# Patient Record
Sex: Male | Born: 1955 | Race: Black or African American | Hispanic: No | Smoking: Current every day smoker
Health system: Southern US, Community
[De-identification: ages and names within clinical notes are randomized; demographics above are authoritative.]

## PROBLEM LIST (undated history)

## (undated) DIAGNOSIS — R413 Other amnesia: Secondary | ICD-10-CM

## (undated) DIAGNOSIS — I679 Cerebrovascular disease, unspecified: Secondary | ICD-10-CM

## (undated) DIAGNOSIS — J320 Chronic maxillary sinusitis: Secondary | ICD-10-CM

## (undated) DIAGNOSIS — I10 Essential (primary) hypertension: Secondary | ICD-10-CM

## (undated) DIAGNOSIS — I639 Cerebral infarction, unspecified: Secondary | ICD-10-CM

## (undated) DIAGNOSIS — R569 Unspecified convulsions: Secondary | ICD-10-CM

## (undated) DIAGNOSIS — F10239 Alcohol dependence with withdrawal, unspecified: Secondary | ICD-10-CM

## (undated) DIAGNOSIS — F10939 Alcohol use, unspecified with withdrawal, unspecified: Secondary | ICD-10-CM

## (undated) DIAGNOSIS — F1010 Alcohol abuse, uncomplicated: Secondary | ICD-10-CM

## (undated) HISTORY — PX: BACK SURGERY: SHX140

## (undated) HISTORY — DX: Essential (primary) hypertension: I10

## (undated) HISTORY — DX: Cerebral infarction, unspecified: I63.9

---

## 2013-12-28 ENCOUNTER — Emergency Department (HOSPITAL_COMMUNITY): Payer: Medicaid Other

## 2013-12-28 ENCOUNTER — Inpatient Hospital Stay (HOSPITAL_COMMUNITY)
Admission: EM | Admit: 2013-12-28 | Discharge: 2014-01-03 | DRG: 896 | Disposition: A | Discharge status: Home / Self Care | Payer: Medicaid Other | Attending: Internal Medicine | Admitting: Internal Medicine

## 2013-12-28 ENCOUNTER — Encounter (HOSPITAL_COMMUNITY): Payer: Self-pay

## 2013-12-28 DIAGNOSIS — F1994 Other psychoactive substance use, unspecified with psychoactive substance-induced mood disorder: Secondary | ICD-10-CM

## 2013-12-28 DIAGNOSIS — F10231 Alcohol dependence with withdrawal delirium: Secondary | ICD-10-CM | POA: Diagnosis present

## 2013-12-28 DIAGNOSIS — F39 Unspecified mood [affective] disorder: Secondary | ICD-10-CM | POA: Diagnosis present

## 2013-12-28 DIAGNOSIS — Z79899 Other long term (current) drug therapy: Secondary | ICD-10-CM | POA: Diagnosis not present

## 2013-12-28 DIAGNOSIS — I10 Essential (primary) hypertension: Secondary | ICD-10-CM | POA: Diagnosis present

## 2013-12-28 DIAGNOSIS — F10930 Alcohol use, unspecified with withdrawal, uncomplicated: Secondary | ICD-10-CM

## 2013-12-28 DIAGNOSIS — F10239 Alcohol dependence with withdrawal, unspecified: Secondary | ICD-10-CM | POA: Diagnosis present

## 2013-12-28 DIAGNOSIS — R569 Unspecified convulsions: Secondary | ICD-10-CM

## 2013-12-28 DIAGNOSIS — G9340 Encephalopathy, unspecified: Secondary | ICD-10-CM | POA: Diagnosis present

## 2013-12-28 DIAGNOSIS — R739 Hyperglycemia, unspecified: Secondary | ICD-10-CM | POA: Diagnosis present

## 2013-12-28 DIAGNOSIS — Z8673 Personal history of transient ischemic attack (TIA), and cerebral infarction without residual deficits: Secondary | ICD-10-CM | POA: Diagnosis not present

## 2013-12-28 DIAGNOSIS — F10939 Alcohol use, unspecified with withdrawal, unspecified: Secondary | ICD-10-CM | POA: Diagnosis present

## 2013-12-28 DIAGNOSIS — W19XXXA Unspecified fall, initial encounter: Secondary | ICD-10-CM

## 2013-12-28 DIAGNOSIS — F17210 Nicotine dependence, cigarettes, uncomplicated: Secondary | ICD-10-CM | POA: Diagnosis present

## 2013-12-28 DIAGNOSIS — I159 Secondary hypertension, unspecified: Secondary | ICD-10-CM

## 2013-12-28 DIAGNOSIS — Z7982 Long term (current) use of aspirin: Secondary | ICD-10-CM | POA: Diagnosis not present

## 2013-12-28 DIAGNOSIS — I679 Cerebrovascular disease, unspecified: Secondary | ICD-10-CM | POA: Diagnosis present

## 2013-12-28 DIAGNOSIS — E876 Hypokalemia: Secondary | ICD-10-CM | POA: Diagnosis present

## 2013-12-28 DIAGNOSIS — F1010 Alcohol abuse, uncomplicated: Secondary | ICD-10-CM

## 2013-12-28 DIAGNOSIS — Z9289 Personal history of other medical treatment: Secondary | ICD-10-CM

## 2013-12-28 DIAGNOSIS — J320 Chronic maxillary sinusitis: Secondary | ICD-10-CM | POA: Diagnosis present

## 2013-12-28 DIAGNOSIS — F10931 Alcohol use, unspecified with withdrawal delirium: Secondary | ICD-10-CM

## 2013-12-28 DIAGNOSIS — Z09 Encounter for follow-up examination after completed treatment for conditions other than malignant neoplasm: Secondary | ICD-10-CM

## 2013-12-28 DIAGNOSIS — F10230 Alcohol dependence with withdrawal, uncomplicated: Secondary | ICD-10-CM

## 2013-12-28 DIAGNOSIS — F10929 Alcohol use, unspecified with intoxication, unspecified: Secondary | ICD-10-CM

## 2013-12-28 HISTORY — DX: Alcohol dependence with withdrawal, unspecified: F10.239

## 2013-12-28 HISTORY — DX: Alcohol use, unspecified with withdrawal, unspecified: F10.939

## 2013-12-28 HISTORY — DX: Other amnesia: R41.3

## 2013-12-28 HISTORY — DX: Unspecified convulsions: R56.9

## 2013-12-28 HISTORY — DX: Cerebrovascular disease, unspecified: I67.9

## 2013-12-28 HISTORY — DX: Chronic maxillary sinusitis: J32.0

## 2013-12-28 HISTORY — DX: Alcohol abuse, uncomplicated: F10.10

## 2013-12-28 LAB — BASIC METABOLIC PANEL
ANION GAP: 19 — AB (ref 5–15)
Anion gap: 25 — ABNORMAL HIGH (ref 5–15)
BUN: 5 mg/dL — ABNORMAL LOW (ref 6–23)
BUN: 6 mg/dL (ref 6–23)
CHLORIDE: 96 meq/L (ref 96–112)
CO2: 22 mEq/L (ref 19–32)
CO2: 18 mEq/L — ABNORMAL LOW (ref 19–32)
CREATININE: 0.84 mg/dL (ref 0.50–1.35)
Calcium: 9.8 mg/dL (ref 8.4–10.5)
Calcium: 10.1 mg/dL (ref 8.4–10.5)
Chloride: 95 mEq/L — ABNORMAL LOW (ref 96–112)
Creatinine, Ser: 0.83 mg/dL (ref 0.50–1.35)
GFR calc non Af Amer: >90 mL/min (ref >90)
Glucose, Bld: 147 mg/dL — ABNORMAL HIGH (ref 70–99)
Glucose, Bld: 136 mg/dL — ABNORMAL HIGH (ref 70–99)
Potassium: 3.2 mEq/L — ABNORMAL LOW (ref 3.7–5.3)
Potassium: 3.6 mEq/L — ABNORMAL LOW (ref 3.7–5.3)
Sodium: 137 mEq/L (ref 137–147)
Sodium: 138 mEq/L (ref 137–147)

## 2013-12-28 LAB — ETHANOL: Alcohol, Ethyl (B): <11 mg/dL (ref 0–11)

## 2013-12-28 LAB — CBC WITH DIFFERENTIAL/PLATELET
BASOS ABS: 0.1 10*3/uL (ref 0.0–0.1)
BASOS PCT: 1 % (ref 0–1)
EOS ABS: 0.1 10*3/uL (ref 0.0–0.7)
Eosinophils Relative: 2 % (ref 0–5)
HEMATOCRIT: 44.7 % (ref 39.0–52.0)
HEMOGLOBIN: 15.4 g/dL (ref 13.0–17.0)
Lymphocytes Relative: 25 % (ref 12–46)
Lymphs Abs: 1.6 10*3/uL (ref 0.7–4.0)
MCH: 32.8 pg (ref 26.0–34.0)
MCHC: 34.5 g/dL (ref 30.0–36.0)
MCV: 95.1 fL (ref 78.0–100.0)
MONO ABS: 0.6 10*3/uL (ref 0.1–1.0)
Monocytes Relative: 9 % (ref 3–12)
NEUTROS ABS: 4.1 10*3/uL (ref 1.7–7.7)
NEUTROS PCT: 63 % (ref 43–77)
Platelets: 199 10*3/uL (ref 150–400)
RBC: 4.7 MIL/uL (ref 4.22–5.81)
RDW: 13.1 % (ref 11.5–15.5)
WBC: 6.5 10*3/uL (ref 4.0–10.5)

## 2013-12-28 LAB — RAPID URINE DRUG SCREEN, HOSP PERFORMED
Amphetamines: NOT DETECTED
BENZODIAZEPINES: NOT DETECTED
Barbiturates: NOT DETECTED
COCAINE: NOT DETECTED
OPIATES: NOT DETECTED
TETRAHYDROCANNABINOL: NOT DETECTED

## 2013-12-28 LAB — MAGNESIUM: MAGNESIUM: 1.4 mg/dL — AB (ref 1.5–2.5)

## 2013-12-28 LAB — CREATININE, SERUM
CREATININE: 0.8 mg/dL (ref 0.50–1.35)
GFR calc non Af Amer: >90 mL/min (ref >90)

## 2013-12-28 MED ORDER — NICOTINE 21 MG/24HR TD PT24
21.0000 mg | MEDICATED_PATCH | Freq: Every day | TRANSDERMAL | Status: DC | PRN
  Filled 2013-12-28: qty 1

## 2013-12-28 MED ORDER — CHLORDIAZEPOXIDE HCL 25 MG PO CAPS: 25.0000 mg | ORAL_CAPSULE | Freq: Four times a day (QID) | ORAL | Status: DC | PRN

## 2013-12-28 MED ORDER — ACETAMINOPHEN 325 MG PO TABS: 650.0000 mg | ORAL_TABLET | Freq: Four times a day (QID) | ORAL | Status: DC | PRN

## 2013-12-28 MED ORDER — VITAMIN B-1 100 MG PO TABS
100.0000 mg | ORAL_TABLET | Freq: Every day | ORAL | Status: DC
  Administered 2013-12-29 – 2014-01-03 (×5): 100 mg via ORAL
  Filled 2013-12-28 (×5): qty 1

## 2013-12-28 MED ORDER — CHLORDIAZEPOXIDE HCL 25 MG PO CAPS: 25.0000 mg | ORAL_CAPSULE | Freq: Every day | ORAL | Status: DC

## 2013-12-28 MED ORDER — ADULT MULTIVITAMIN W/MINERALS CH
1.0000 | ORAL_TABLET | Freq: Every day | ORAL | Status: DC
  Administered 2013-12-29 – 2014-01-03 (×6): 1 via ORAL
  Filled 2013-12-28 (×6): qty 1

## 2013-12-28 MED ORDER — ADULT MULTIVITAMIN W/MINERALS CH: 1.0000 | ORAL_TABLET | Freq: Every day | ORAL | Status: DC

## 2013-12-28 MED ORDER — VITAMIN B-1 100 MG PO TABS
100.0000 mg | ORAL_TABLET | Freq: Every day | ORAL | Status: DC
  Filled 2013-12-28: qty 1

## 2013-12-28 MED ORDER — LORAZEPAM 2 MG/ML IJ SOLN
1.0000 mg | Freq: Once | INTRAMUSCULAR | Status: AC
  Administered 2013-12-28: 1 mg via INTRAVENOUS
  Filled 2013-12-28: qty 1

## 2013-12-28 MED ORDER — ONDANSETRON HCL 4 MG PO TABS: 4.0000 mg | ORAL_TABLET | Freq: Three times a day (TID) | ORAL | Status: DC | PRN

## 2013-12-28 MED ORDER — ALUM & MAG HYDROXIDE-SIMETH 200-200-20 MG/5ML PO SUSP: 30.0000 mL | ORAL | Status: DC | PRN

## 2013-12-28 MED ORDER — HEPARIN SODIUM (PORCINE) 5000 UNIT/ML IJ SOLN
5000.0000 [IU] | Freq: Three times a day (TID) | INTRAMUSCULAR | Status: DC
  Administered 2013-12-28 – 2014-01-03 (×16): 5000 [IU] via SUBCUTANEOUS
  Filled 2013-12-28 (×20): qty 1

## 2013-12-28 MED ORDER — LORAZEPAM 2 MG/ML IJ SOLN
1.0000 mg | Freq: Four times a day (QID) | INTRAMUSCULAR | Status: DC | PRN
  Administered 2013-12-29 – 2013-12-30 (×4): 1 mg via INTRAVENOUS
  Filled 2013-12-28 (×5): qty 1

## 2013-12-28 MED ORDER — LORAZEPAM 1 MG PO TABS: 0.0000 mg | ORAL_TABLET | Freq: Two times a day (BID) | ORAL | Status: DC

## 2013-12-28 MED ORDER — THIAMINE HCL 100 MG/ML IJ SOLN: 100.0000 mg | Freq: Every day | INTRAMUSCULAR | Status: DC

## 2013-12-28 MED ORDER — CHLORDIAZEPOXIDE HCL 25 MG PO CAPS
25.0000 mg | ORAL_CAPSULE | Freq: Every day | ORAL | Status: DC
Start: 2014-01-01 — End: 2013-12-28

## 2013-12-28 MED ORDER — NICOTINE 21 MG/24HR TD PT24: 21.0000 mg | MEDICATED_PATCH | Freq: Every day | TRANSDERMAL | Status: DC | PRN

## 2013-12-28 MED ORDER — MAGNESIUM HYDROXIDE 400 MG/5ML PO SUSP: 30.0000 mL | Freq: Every day | ORAL | Status: DC | PRN

## 2013-12-28 MED ORDER — DONEPEZIL HCL 10 MG PO TABS
10.0000 mg | ORAL_TABLET | Freq: Every day | ORAL | Status: DC
  Administered 2013-12-28 – 2014-01-02 (×4): 10 mg via ORAL
  Filled 2013-12-28 (×7): qty 1

## 2013-12-28 MED ORDER — ADULT MULTIVITAMIN W/MINERALS CH
1.0000 | ORAL_TABLET | Freq: Every day | ORAL | Status: DC
  Administered 2013-12-28: 1 via ORAL
  Filled 2013-12-28: qty 1

## 2013-12-28 MED ORDER — CLONIDINE HCL 0.2 MG/24HR TD PTWK
0.2000 mg | MEDICATED_PATCH | TRANSDERMAL | Status: DC
  Administered 2013-12-28: 0.2 mg via TRANSDERMAL
  Filled 2013-12-28: qty 1

## 2013-12-28 MED ORDER — ASPIRIN 81 MG PO TABS: 81.0000 mg | ORAL_TABLET | Freq: Every evening | ORAL | Status: DC

## 2013-12-28 MED ORDER — CHLORDIAZEPOXIDE HCL 25 MG PO CAPS: 25.0000 mg | ORAL_CAPSULE | ORAL | Status: DC

## 2013-12-28 MED ORDER — OXYCODONE HCL 5 MG PO TABS: 5.0000 mg | ORAL_TABLET | ORAL | Status: DC | PRN

## 2013-12-28 MED ORDER — PROMETHAZINE HCL 25 MG PO TABS
12.5000 mg | ORAL_TABLET | Freq: Four times a day (QID) | ORAL | Status: DC | PRN
Start: 2013-12-28 — End: 2014-01-03

## 2013-12-28 MED ORDER — LORAZEPAM 1 MG PO TABS
1.0000 mg | ORAL_TABLET | Freq: Three times a day (TID) | ORAL | Status: DC | PRN
  Administered 2013-12-28: 1 mg via ORAL
  Filled 2013-12-28: qty 1

## 2013-12-28 MED ORDER — HYDROXYZINE HCL 25 MG PO TABS
25.0000 mg | ORAL_TABLET | Freq: Four times a day (QID) | ORAL | Status: DC | PRN
Start: 2013-12-28 — End: 2013-12-28

## 2013-12-28 MED ORDER — POTASSIUM CHLORIDE IN NACL 40-0.9 MEQ/L-% IV SOLN
INTRAVENOUS | Status: DC
  Administered 2013-12-28 – 2013-12-30 (×6): 75 mL/h via INTRAVENOUS
  Filled 2013-12-28 (×5): qty 1000

## 2013-12-28 MED ORDER — LORAZEPAM 1 MG PO TABS
0.0000 mg | ORAL_TABLET | Freq: Four times a day (QID) | ORAL | Status: DC
  Administered 2013-12-28 (×2): 2 mg via ORAL
  Filled 2013-12-28 (×2): qty 2

## 2013-12-28 MED ORDER — VITAMIN B-1 100 MG PO TABS
100.0000 mg | ORAL_TABLET | Freq: Every day | ORAL | Status: DC
  Administered 2013-12-28: 100 mg via ORAL
  Filled 2013-12-28: qty 1

## 2013-12-28 MED ORDER — FOLIC ACID 1 MG PO TABS
1.0000 mg | ORAL_TABLET | Freq: Every day | ORAL | Status: DC
  Administered 2013-12-28 – 2014-01-03 (×7): 1 mg via ORAL
  Filled 2013-12-28 (×7): qty 1

## 2013-12-28 MED ORDER — LORAZEPAM 1 MG PO TABS
1.0000 mg | ORAL_TABLET | Freq: Four times a day (QID) | ORAL | Status: DC | PRN
Start: 2013-12-28 — End: 2013-12-30

## 2013-12-28 MED ORDER — CHLORDIAZEPOXIDE HCL 25 MG PO CAPS: 25.0000 mg | ORAL_CAPSULE | Freq: Three times a day (TID) | ORAL | Status: DC

## 2013-12-28 MED ORDER — CHLORDIAZEPOXIDE HCL 25 MG PO CAPS
25.0000 mg | ORAL_CAPSULE | Freq: Four times a day (QID) | ORAL | Status: DC
  Administered 2013-12-28: 25 mg via ORAL
  Filled 2013-12-28 (×2): qty 1

## 2013-12-28 MED ORDER — LORAZEPAM 2 MG/ML IJ SOLN
0.0000 mg | Freq: Two times a day (BID) | INTRAMUSCULAR | Status: DC
Start: 2013-12-30 — End: 2013-12-30

## 2013-12-28 MED ORDER — LORAZEPAM 2 MG/ML IJ SOLN
0.0000 mg | Freq: Four times a day (QID) | INTRAMUSCULAR | Status: DC
  Administered 2013-12-29: 3 mg via INTRAVENOUS
  Administered 2013-12-29: 1 mg via INTRAVENOUS
  Administered 2013-12-29: 2 mg via INTRAVENOUS
  Administered 2013-12-29: 1 mg via INTRAVENOUS
  Administered 2013-12-29: 2 mg via INTRAVENOUS
  Administered 2013-12-30: 3 mg via INTRAVENOUS
  Administered 2013-12-30: 4 mg via INTRAVENOUS
  Filled 2013-12-28: qty 1
  Filled 2013-12-28 (×2): qty 2
  Filled 2013-12-28 (×4): qty 1
  Filled 2013-12-28: qty 2

## 2013-12-28 MED ORDER — ONDANSETRON HCL 4 MG/2ML IJ SOLN: 4.0000 mg | Freq: Three times a day (TID) | INTRAMUSCULAR | Status: AC | PRN

## 2013-12-28 MED ORDER — SODIUM CHLORIDE 0.9 % IV BOLUS (SEPSIS)
1000.0000 mL | Freq: Once | INTRAVENOUS | Status: AC
  Administered 2013-12-28: 1000 mL via INTRAVENOUS

## 2013-12-28 MED ORDER — LOPERAMIDE HCL 2 MG PO CAPS
2.0000 mg | ORAL_CAPSULE | ORAL | Status: DC | PRN
  Administered 2013-12-28: 2 mg via ORAL
  Filled 2013-12-28: qty 1

## 2013-12-28 MED ORDER — THIAMINE HCL 100 MG/ML IJ SOLN
100.0000 mg | Freq: Every day | INTRAMUSCULAR | Status: DC
Start: 2013-12-29 — End: 2014-01-02
  Administered 2013-12-30: 100 mg via INTRAVENOUS
  Filled 2013-12-28: qty 2
  Filled 2013-12-28: qty 1

## 2013-12-28 MED ORDER — ACETAMINOPHEN 325 MG PO TABS
650.0000 mg | ORAL_TABLET | Freq: Four times a day (QID) | ORAL | Status: DC | PRN
  Administered 2013-12-29: 650 mg via ORAL
  Filled 2013-12-28: qty 2

## 2013-12-28 MED ORDER — HYDROXYZINE HCL 25 MG PO TABS: 25.0000 mg | ORAL_TABLET | Freq: Four times a day (QID) | ORAL | Status: DC | PRN

## 2013-12-28 MED ORDER — SODIUM CHLORIDE 0.9 % IJ SOLN
3.0000 mL | Freq: Two times a day (BID) | INTRAMUSCULAR | Status: DC
  Administered 2013-12-29 – 2014-01-02 (×7): 3 mL via INTRAVENOUS

## 2013-12-28 MED ORDER — NICOTINE 14 MG/24HR TD PT24
14.0000 mg | MEDICATED_PATCH | Freq: Every day | TRANSDERMAL | Status: DC
  Administered 2013-12-29 – 2014-01-03 (×6): 14 mg via TRANSDERMAL
  Filled 2013-12-28 (×6): qty 1

## 2013-12-28 MED ORDER — ALUM & MAG HYDROXIDE-SIMETH 200-200-20 MG/5ML PO SUSP: 30.0000 mL | Freq: Four times a day (QID) | ORAL | Status: DC | PRN

## 2013-12-28 MED ORDER — POTASSIUM CHLORIDE CRYS ER 20 MEQ PO TBCR
40.0000 meq | EXTENDED_RELEASE_TABLET | Freq: Once | ORAL | Status: AC
  Administered 2013-12-28: 40 meq via ORAL
  Filled 2013-12-28: qty 2

## 2013-12-28 MED ORDER — CHLORDIAZEPOXIDE HCL 25 MG PO CAPS
25.0000 mg | ORAL_CAPSULE | Freq: Four times a day (QID) | ORAL | Status: DC
  Administered 2013-12-28: 25 mg via ORAL
  Filled 2013-12-28: qty 1

## 2013-12-28 MED ORDER — ACETAMINOPHEN 650 MG RE SUPP: 650.0000 mg | Freq: Four times a day (QID) | RECTAL | Status: DC | PRN

## 2013-12-28 MED ORDER — ASPIRIN EC 81 MG PO TBEC
81.0000 mg | DELAYED_RELEASE_TABLET | Freq: Every evening | ORAL | Status: DC
  Administered 2013-12-28 – 2014-01-02 (×5): 81 mg via ORAL
  Filled 2013-12-28 (×6): qty 1

## 2013-12-28 MED ORDER — LOPERAMIDE HCL 2 MG PO CAPS: 2.0000 mg | ORAL_CAPSULE | ORAL | Status: DC | PRN

## 2013-12-28 NOTE — ED Notes (Signed)
Per EMS- Patient had a seizure at home and discovered by his son. Patient disoriented to time. No obvious injury. Patient c/o headache.

## 2013-12-28 NOTE — ED Notes (Signed)
Patient's belongings pants, belt, shirt and coat placed in belongings bags x 2 and to be taken home by patient's girlfriend.

## 2013-12-28 NOTE — ED Provider Notes (Signed)
The patient has had a progressive confusion, on exam the patient has a mild tremor, I suspect that he is going into delirium tremens and thus I have admitted him to the hospital under the service of Dr. Darnelle Catalanama of the hospitalist service. He will go to a stepdown bed.  Derrick RollerBrian D Naeem Quillin, MD 12/28/13 [phone]

## 2013-12-28 NOTE — Progress Notes (Signed)
Utilization Review completed.  Jammi Morrissette RN CM  

## 2013-12-28 NOTE — ED Notes (Signed)
Patient tried to urinate but couldn't, urinal is with patient, to try again soon

## 2013-12-28 NOTE — ED Notes (Signed)
Attempted to call report. Shift change occuring and receiving nurse will call back for report.

## 2013-12-28 NOTE — ED Notes (Signed)
Patient's fiance reported to EDP that the patient was trying to detox himself from alcohol and had a seizure. Patient requested detox to EDP.

## 2013-12-28 NOTE — ED Provider Notes (Signed)
CSN: [id number]     Arrival date & time 12/28/13  0804 History   First MD Initiated Contact with Patient 12/28/13 [phone]     Chief Complaint  Patient presents with  . Seizures  . Withdrawal  . Alcohol Problem      HPI  Pt was seen at 0825. Per EMS, pt's family and pt: c/o sudden onset and resolution of one episode of "seizure" that occurred this morning PTA. Pt's wife was on the phone with pt and states "he didn't sound right," so she had her son check on him. Pt was found by him s/p seizure. Pt states he has been "cutting down drinking" etoh for the past 1 week. Pt's LD etoh approximately 2000 last night. Pt's family states he has been "Proofreadergetting shakier and shakier" over the past few days. Pt has hx of alcohol withdrawal seizures several years ago. Pt requesting etoh detox. Pt's only c/o "feeling really shaky now." Denies SI, no SA, no HI, no hallucinations.    Past Medical History  Diagnosis Date  . Memory problem   . Alcohol abuse   . Alcohol withdrawal seizure    Past Surgical History  Procedure Laterality Date  . Back surgery      History  Substance Use Topics  . Smoking status: Current Every Day Smoker -- 0.50 packs/day    Types: Cigarettes  . Smokeless tobacco: Never Used  . Alcohol Use: Yes     Comment: lasst drink at 1000 last night.    Review of Systems ROS: Statement: All systems negative except as marked or noted in the HPI; Constitutional: Negative for fever and chills. ; ; Eyes: Negative for eye pain, redness and discharge. ; ; ENMT: Negative for ear pain, hoarseness, nasal congestion, sinus pressure and sore throat. ; ; Cardiovascular: Negative for chest pain, palpitations, diaphoresis, dyspnea and peripheral edema. ; ; Respiratory: Negative for cough, wheezing and stridor. ; ; Gastrointestinal: Negative for nausea, vomiting, diarrhea, abdominal pain, blood in stool, hematemesis, jaundice and rectal bleeding. . ; ; Genitourinary: Negative for dysuria, flank pain and  hematuria. ; ; Musculoskeletal: Negative for back pain and neck pain. Negative for swelling and trauma.; ; Skin: Negative for pruritus, rash, abrasions, blisters, bruising and skin lesion.; ; Neuro: Negative for headache, lightheadedness and neck stiffness. Negative for weakness, extremity weakness, paresthesias, +seizure.     Allergies  Review of patient's allergies indicates no known allergies.  Home Medications   Prior to Admission medications   Medication Sig Start Date End Date Taking? Authorizing Provider  aspirin 81 MG tablet Take 81 mg by mouth every evening.   Yes Historical Provider, MD  donepezil (ARICEPT) 10 MG tablet Take 10 mg by mouth at bedtime.   Yes Historical Provider, MD  ibuprofen (ADVIL,MOTRIN) 200 MG tablet Take 1,000 mg by mouth every 6 (six) hours as needed for mild pain or moderate pain.   Yes Historical Provider, MD   BP 130/77 mmHg  Pulse 92  Temp(Src) 98.9 F (37.2 C) (Oral)  Resp 16  SpO2 95%    Physical Exam  0830: Physical examination: Vital signs and O2 SAT: Reviewed; Constitutional: Well developed, Well nourished, Well hydrated, In no acute distress; Head and Face: Normocephalic, No scalp hematomas, no lacs. +faint erythema to right lateral eyebrow area. No open wounds, no ecchymosis. Non-tender to palp superior and inferior orbital rim areas.  No zygoma tenderness.  No mandibular tenderness.; Eyes: EOMI, PERRL, No scleral icterus. No obvious hyphema bilat.; ENMT: Mouth and  pharynx normal, Left TM normal, Right TM normal, Mucous membranes moist, +teeth and tongue intact.  No intraoral or intranasal bleeding.  No septal hematomas.  No trismus, no malocclusion.; Neck: Supple, Full range of motion, No lymphadenopathy;  Cardiovascular: Regular rate and rhythm, No gallop; Respiratory: Breath sounds clear & equal bilaterally, No rales, rhonchi, wheezes.  Speaking full sentences with ease, Normal respiratory effort/excursion; Chest: Nontender, Movement normal;  Abdomen: Soft, Nontender, Nondistended, Normal bowel sounds; Genitourinary: No CVA tenderness; Extremities: Pulses normal, No tenderness, No edema, No calf edema or asymmetry.; Neuro: AA&Ox3, Major CN grossly intact. Speech clear. No gross focal motor or sensory deficits in extremities. +tremorous during exam.; Skin: Color normal, Warm, Dry.   ED Course  Procedures     MDM  MDM Reviewed: vitals and nursing note Interpretation: labs and CT scan      Results for orders placed or performed during the hospital encounter of 12/28/13  Ethanol  Result Value Ref Range   Alcohol, Ethyl (B) <11 0 - 11 mg/dL  Urine rapid drug screen (hosp performed)  Result Value Ref Range   Opiates NONE DETECTED NONE DETECTED   Cocaine NONE DETECTED NONE DETECTED   Benzodiazepines NONE DETECTED NONE DETECTED   Amphetamines NONE DETECTED NONE DETECTED   Tetrahydrocannabinol NONE DETECTED NONE DETECTED   Barbiturates NONE DETECTED NONE DETECTED  CBC with Differential  Result Value Ref Range   WBC 6.5 4.0 - 10.5 K/uL   RBC 4.70 4.22 - 5.81 MIL/uL   Hemoglobin 15.4 13.0 - 17.0 g/dL   HCT 16.144.7 09.639.0 - 04.552.0 %   MCV 95.1 78.0 - 100.0 fL   MCH 32.8 26.0 - 34.0 pg   MCHC 34.5 30.0 - 36.0 g/dL   RDW 40.913.1 81.111.5 - 91.415.5 %   Platelets 199 150 - 400 K/uL   Neutrophils Relative % 63 43 - 77 %   Neutro Abs 4.1 1.7 - 7.7 K/uL   Lymphocytes Relative 25 12 - 46 %   Lymphs Abs 1.6 0.7 - 4.0 K/uL   Monocytes Relative 9 3 - 12 %   Monocytes Absolute 0.6 0.1 - 1.0 K/uL   Eosinophils Relative 2 0 - 5 %   Eosinophils Absolute 0.1 0.0 - 0.7 K/uL   Basophils Relative 1 0 - 1 %   Basophils Absolute 0.1 0.0 - 0.1 K/uL  Basic metabolic panel  Result Value Ref Range   Sodium 137 137 - 147 mEq/L   Potassium 3.2 (L) 3.7 - 5.3 mEq/L   Chloride 96 96 - 112 mEq/L   CO2 22 19 - 32 mEq/L   Glucose, Bld 147 (H) 70 - 99 mg/dL   BUN 5 (L) 6 - 23 mg/dL   Creatinine, Ser 7.820.83 0.50 - 1.35 mg/dL   Calcium 9.8 8.4 - 95.610.5 mg/dL   GFR  calc non Af Amer >90 >90 mL/min   GFR calc Af Amer >90 >90 mL/min   Anion gap 19 (H) 5 - 15    Ct Head Wo Contrast 12/28/2013   CLINICAL DATA:  By report, patient trying to withdraw from recent excessive alcohol use and sustained a seizure earlier today associated with headache. Prior history of alcohol withdrawal seizures.  EXAM: CT HEAD WITHOUT CONTRAST  TECHNIQUE: Contiguous axial images were obtained from the base of the skull through the vertex without intravenous contrast.  COMPARISON:  None.  FINDINGS: Moderate cortical and deep atrophy and moderate to severe cerebellar atrophy for age. Severe changes of small vessel disease of the  white matter diffusely with old lacunar strokes in the left basal ganglia and right thalamus. No mass lesion. No midline shift. No acute hemorrhage or hematoma. No extra-axial fluid collections. No evidence of acute infarction.  No skull fracture or other focal osseous abnormality involving the skull. Absence of the anterior inferior medial wall of the left maxillary sinus, with soft tissue protruding into the left nasal cavity, with severe mucosal thickening in the left maxillary sinus and thickening of the sinus walls, indicating chronicity. Apparent prior bilateral turbinectomies. Remaining visualized paranasal sinuses, bilateral mastoid air cells and bilateral middle ear cavities well aerated. Mild bilateral carotid siphon atherosclerosis.  IMPRESSION: 1. No acute intracranial abnormality. 2. Moderate to severe generalized atrophy and severe chronic microvascular ischemic changes of the white matter. 3. 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 4. Severe chronic left maxillary sinusitis. If the patient has not had a prior medial antrostomy, there is erosion of the anterior inferior medial wall of the sinus. Possible mucocele in the left nasal cavity.   Electronically Signed   By: Hulan Saas M.D.   On: 12/28/2013 10:24    1000:  CIWA  protocol ordered on pt's arrival to the ED with improvement in pt's tremors.  Pt's VS remain stable. TTS eval pending.     Samuel Jester, DO 12/28/13 1438

## 2013-12-28 NOTE — Consult Note (Signed)
Derrick Cook   Reason for Cook:  Alcohol detox Referring Physician:  EDP  Derrick Cook is an 59 y.o. male. Total Time spent with patient: 45 minutes  Assessment: AXIS I:  Alcohol Abuse and Substance Induced Mood Disorder AXIS II:  Deferred AXIS III:   Past Medical History  Diagnosis Date  . Memory problem   . Alcohol abuse   . Alcohol withdrawal seizure    AXIS IV:  other psychosocial or environmental problems AXIS V:  41-50 serious symptoms  Plan:  Inpatient detox  Subjective:   Derrick Cook is a 58 y.o. male patient presents to [facility] requesting alcohol detox.  HPI:  Patient states for the last six months he has been drinking less and less trying  off of alcohol.  Patient states he continues to drink daily.  Girlfriend at bedside and states that patient had a seizure 3 weeks ago related to decrease in alcohol intake and then he had another one to day.  Patient denies suicidal/homicidal ideation, psychosis, and paranoia.    HPI Elements:   Location:  alcohol abuse. Quality:  alcohol detox. Severity:  alcohol withdrawl. Timing:  1 day. Review of Systems  Neurological: Positive for seizures (Related to alcohol withdrawal).  Psychiatric/Behavioral: Positive for substance abuse. Negative for depression, suicidal ideas, hallucinations and memory loss. The patient is not nervous/anxious and does not have insomnia.   History reviewed. No pertinent family history.   Past Psychiatric History: Past Medical History  Diagnosis Date  . Memory problem   . Alcohol abuse   . Alcohol withdrawal seizure     reports that he has been smoking Cigarettes.  He has been smoking about 0.50 packs per day. He has never used smokeless tobacco. He reports that he drinks alcohol. He reports that he does not use illicit drugs. History reviewed. No pertinent family history. Family History Substance Abuse:  (unknown) Family Supports:  (patients fiance at bedside) Living  Arrangements: Other (Comment) (lives with fiance and her 2 children; recently moved from [place]) Can pt return to current living arrangement?: Yes Abuse/Neglect [facility]) Physical Abuse: Denies Verbal Abuse: Denies Sexual Abuse: Denies Allergies:  No Known Allergies  ACT Assessment Complete:  Yes:    Educational Status    Risk to Self: Risk to self with the past 6 months Suicidal Ideation:  (unk; pt did not confirm nor deny; pt drowsy during the asses) Suicidal Intent: No Is patient at risk for suicide?: No Suicidal Plan?: No Access to Means: No What has been your use of drugs/alcohol within the last 12 months?:  (patient reports alchohol used last night; has a hx of abuse) Previous Attempts/Gestures:  (unk; pt did not confirm nor deny) How many times?:  (0) Other Self Harm Risks:  (n/a) Triggers for Past Attempts: Other (Comment) (no previous attempts/gestures reported ) Intentional Self Injurious Behavior: None Family Suicide History: Unknown Recent stressful life event(s): Other (Comment) (does not report any specific stressors; drowsy during assess) Persecutory voices/beliefs?: No Depression: Yes Depression Symptoms: Feeling angry/irritable, Feeling worthless/self pity, Loss of interest in usual pleasures, Guilt, Fatigue, Isolating, Tearfulness, Insomnia, Despondent Substance abuse history and/or treatment for substance abuse?: No Suicide prevention information given to non-admitted patients: Not applicable  Risk to Others: Risk to Others within the past 6 months Homicidal Ideation: No Thoughts of Harm to Others: No Current Homicidal Intent: No Current Homicidal Plan: No Access to Homicidal Means: No Identified Victim:  (n/a) History of harm to others?: No Assessment of Violence: None  Noted Violent Behavior Description:  (patient is calm and cooperative ) Does patient have access to weapons?: No Criminal Charges Pending?: No Does patient have a court date: No  Abuse:  Abuse/Neglect Assessment (Assessment to be complete while patient is alone) Physical Abuse: Denies Verbal Abuse: Denies Sexual Abuse: Denies Exploitation of patient/patient's resources: Denies Self-Neglect: Denies  Prior Inpatient Therapy: Prior Inpatient Therapy Prior Inpatient Therapy: Yes Prior Therapy Dates:  (uanble to recall dates ) Prior Therapy Facilty/Provider(s):  (patient unable to recall names of facilities) Reason for Treatment:  (alcohol (substance abuse))  Prior Outpatient Therapy: Prior Outpatient Therapy Prior Outpatient Therapy: No Prior Therapy Dates:  (n/a) Prior Therapy Facilty/Provider(s):  (n/a) Reason for Treatment:  (n/a)  Additional Information: Additional Information 1:1 In Past 12 Months?: No CIRT Risk: No Elopement Risk: No Does patient have medical clearance?: Yes                  Objective: Blood pressure 143/103, pulse 112, temperature 98.4 F (36.9 C), temperature source Oral, resp. rate 16, SpO2 97 %.There is no height or weight on file to calculate BMI. Results for orders placed or performed during the hospital encounter of 12/28/13 (from the past 72 hour(s))  Ethanol     Status: None   Collection Time: 12/28/13  8:34 AM  Result Value Ref Range   Alcohol, Ethyl (B) <11 0 - 11 mg/dL    Comment:        LOWEST DETECTABLE LIMIT FOR SERUM ALCOHOL IS 11 mg/dL FOR MEDICAL PURPOSES ONLY   CBC with Differential     Status: None   Collection Time: 12/28/13  8:34 AM  Result Value Ref Range   WBC 6.5 4.0 - 10.5 K/uL   RBC 4.70 4.22 - 5.81 MIL/uL   Hemoglobin 15.4 13.0 - 17.0 g/dL   HCT 44.7 39.0 - 52.0 %   MCV 95.1 78.0 - 100.0 fL   MCH 32.8 26.0 - 34.0 pg   MCHC 34.5 30.0 - 36.0 g/dL   RDW 13.1 11.5 - 15.5 %   Platelets 199 150 - 400 K/uL   Neutrophils Relative % 63 43 - 77 %   Neutro Abs 4.1 1.7 - 7.7 K/uL   Lymphocytes Relative 25 12 - 46 %   Lymphs Abs 1.6 0.7 - 4.0 K/uL   Monocytes Relative 9 3 - 12 %   Monocytes Absolute  0.6 0.1 - 1.0 K/uL   Eosinophils Relative 2 0 - 5 %   Eosinophils Absolute 0.1 0.0 - 0.7 K/uL   Basophils Relative 1 0 - 1 %   Basophils Absolute 0.1 0.0 - 0.1 K/uL  Basic metabolic panel     Status: Abnormal   Collection Time: 12/28/13  8:34 AM  Result Value Ref Range   Sodium 137 137 - 147 mEq/L   Potassium 3.2 (L) 3.7 - 5.3 mEq/L   Chloride 96 96 - 112 mEq/L   CO2 22 19 - 32 mEq/L   Glucose, Bld 147 (H) 70 - 99 mg/dL   BUN 5 (L) 6 - 23 mg/dL   Creatinine, Ser 0.83 0.50 - 1.35 mg/dL   Calcium 9.8 8.4 - 10.5 mg/dL   GFR calc non Af Amer >90 >90 mL/min   GFR calc Af Amer >90 >90 mL/min    Comment: (NOTE) The eGFR has been calculated using the CKD EPI equation. This calculation has not been validated in all clinical situations. eGFR's persistently <90 mL/min signify possible Chronic Kidney Disease.  Anion gap 19 (H) 5 - 15  Urine rapid drug screen (hosp performed)     Status: None   Collection Time: 12/28/13  9:58 AM  Result Value Ref Range   Opiates NONE DETECTED NONE DETECTED   Cocaine NONE DETECTED NONE DETECTED   Benzodiazepines NONE DETECTED NONE DETECTED   Amphetamines NONE DETECTED NONE DETECTED   Tetrahydrocannabinol NONE DETECTED NONE DETECTED   Barbiturates NONE DETECTED NONE DETECTED    Comment:        DRUG SCREEN FOR MEDICAL PURPOSES ONLY.  IF CONFIRMATION IS NEEDED FOR ANY PURPOSE, NOTIFY LAB WITHIN 5 DAYS.        LOWEST DETECTABLE LIMITS FOR URINE DRUG SCREEN Drug Class       Cutoff (ng/mL) Amphetamine      1000 Barbiturate      200 Benzodiazepine   643 Tricyclics       329 Opiates          300 Cocaine          300 THC              50    Labs are reviewed above values.  Medication reviewed librium started for alcohol detox  Current Facility-Administered Medications  Medication Dose Route Frequency Provider Last Rate Last Dose  . alum & mag hydroxide-simeth (MAALOX/MYLANTA) 200-200-20 MG/5ML suspension 30 mL  30 mL Oral PRN Francine Graven, DO       . LORazepam (ATIVAN) tablet 0-4 mg  0-4 mg Oral 4 times per day Francine Graven, DO   2 mg at 12/28/13 1155   Followed by  . [START ON 12/30/2013] LORazepam (ATIVAN) tablet 0-4 mg  0-4 mg Oral Q12H Francine Graven, DO      . LORazepam (ATIVAN) tablet 1 mg  1 mg Oral Q8H PRN Francine Graven, DO   1 mg at 12/28/13 0945  . nicotine (NICODERM CQ - dosed in mg/24 hours) patch 21 mg  21 mg Transdermal Daily PRN Francine Graven, DO      . ondansetron [facility]) tablet 4 mg  4 mg Oral Q8H PRN Francine Graven, DO      . thiamine (VITAMIN B-1) tablet 100 mg  100 mg Oral Daily Francine Graven, DO   100 mg at 12/28/13 0945   Or  . thiamine (B-1) injection 100 mg  100 mg Intravenous Daily Francine Graven, DO       Current Outpatient Prescriptions  Medication Sig Dispense Refill  . aspirin 81 MG tablet Take 81 mg by mouth every evening.    . donepezil (ARICEPT) 10 MG tablet Take 10 mg by mouth at bedtime.    Marland Kitchen ibuprofen (ADVIL,MOTRIN) 200 MG tablet Take 1,000 mg by mouth every 6 (six) hours as needed for mild pain or moderate pain.      Psychiatric Specialty Exam:     Blood pressure 143/103, pulse 112, temperature 98.4 F (36.9 C), temperature source Oral, resp. rate 16, SpO2 97 %.There is no height or weight on file to calculate BMI.  General Appearance: Casual and Disheveled  Eye Contact::  Minimal  Speech:  Clear and Coherent  Volume:  Decreased  Mood:  Depressed  Affect:  Congruent  Thought Process:  Circumstantial  Orientation:  Full (Time, Place, and Person)  Thought Content:  "want detox"  Suicidal Thoughts:  No  Homicidal Thoughts:  No  Memory:  Immediate;   Fair Recent;   Fair Remote;   Fair  Judgement:  Fair  Insight:  Present  Psychomotor Activity:  Decreased and Tremor  Concentration:  Fair  Recall:  [employer] [place]: Good  Akathisia:  No  Handed:  Right  AIMS (if indicated):     Assets:  Communication Skills Desire for  Improvement Housing Social Support  Sleep:      Musculoskeletal: Strength & Muscle Tone: within normal limits Gait & Station: Did not see patient ambulate Patient leans: N/A  Treatment Plan Summary: Daily contact with patient to assess and evaluate symptoms and progress in treatment Medication management Inpatient detox recommended   Patient has been accepted by [facility] 303/01  Rankin, Shuvon, FNP-BC 12/28/2013 5:05 PM  Patient seen, evaluated and I agree with notes by Nurse Practitioner. Corena Pilgrim, MD

## 2013-12-28 NOTE — ED Notes (Signed)
psych team into see patient

## 2013-12-28 NOTE — ED Notes (Signed)
Patient is confused and disoriented to place and time.  Denied etoh ingestion and stated "I had a stroke 2 days ago." There is no medical history to confirm this. C/O side pain.  Call to EDP.

## 2013-12-28 NOTE — H&P (Signed)
History and Physical:    Derrick Cook WUJ:[id number] DOB: Oct 21, 1955 DOA: 12/28/2013  Referring physician: Dr. Eber Hong PCP: No primary care provider on file.   Chief Complaint: Alcohol withdrawal  History of Present Illness:   Derrick Cook is an 58 y.o. male with a PMH of ETOH abuse, history of alcohol withdrawal seizures with a seizure noted this a.m., who we are asked to admit secondary to the development of DTs with prominent tremulousness, confusion and disorientation. Patient is too confused to provide any significant history although he does deny fever, chills, shortness of breath and chest pain. When asked additional questions about his symptoms, he repeatedly states "I don't know ". He is evasive and is completely disoriented except for self and place. The patient was brought to the emergency department by EMS for detox. According to notes from the psychiatric nurse practitioner, patient's girlfriend who was at the bedside at the time, stated that the patient has been trying to wean himself off alcohol over the past 6 months but that he continues to drink daily. He had a seizure 3 weeks ago triggered by a decrease in alcohol intake and had a second seizure today. His last drink was at 8 AM yesterday morning.  ROS:   Unable to obtain.   Past Medical History:   Past Medical History  Diagnosis Date  . Memory problem   . Alcohol abuse   . Alcohol withdrawal seizure   . 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 Chronic left maxillary sinusitis     Past Surgical History:   Past Surgical History  Procedure Laterality Date  . Back surgery      Social History:   History   Social History  . Marital Status: Significant Other    Spouse Name: N/A    Number of Children: N/A  . Years of Education: N/A   Occupational History  . Not on file.   Social History Main Topics  . Smoking status: Current Every Day Smoker --  0.50 packs/day    Types: Cigarettes  . Smokeless tobacco: Never Used  . Alcohol Use: 0.0 oz/week    0 Not specified per week     Comment: Heavy alcohol use  . Drug Use: No  . Sexual Activity: Not on file   Other Topics Concern  . Not on file   Social History Narrative   Patient recently moved from [place] to be with his fiance. He lives in the home with his fiance and her 2 children. Since moving to [place] to be with his fiance he has tried to stop drinking.     Family history:   Unable to obtain secondary to mental status changes.  Allergies   Review of patient's allergies indicates no known allergies.  Current Medications:   Prior to Admission medications   Medication Sig Start Date End Date Taking? Authorizing Provider  aspirin 81 MG tablet Take 81 mg by mouth every evening.   Yes Historical Provider, MD  donepezil (ARICEPT) 10 MG tablet Take 10 mg by mouth at bedtime.   Yes Historical Provider, MD  ibuprofen (ADVIL,MOTRIN) 200 MG tablet Take 1,000 mg by mouth every 6 (six) hours as needed for mild pain or moderate pain.   Yes Historical Provider, MD    Physical Exam:   Filed Vitals:   12/28/13 1320 12/28/13 1400 12/28/13 1658 12/28/13 1800  BP: 149/95 159/91 143/103 146/104  Pulse: 85  89 112 92  Temp:   98.4 F (36.9 C)   TempSrc:   Oral   Resp: 17 17 16 21   SpO2: 97% 98% 97% 95%     Physical Exam: Blood pressure 146/104, pulse 92, temperature 98.4 F (36.9 C), temperature source Oral, resp. rate 21, SpO2 95 %. Gen: Tremulous and restless Head: Normocephalic, mild abrasion left cheek area. Eyes: PERRL, EOMI, sclerae nonicteric. Mouth: Oropharynx clear with trauma to the lips. Neck: Supple, no thyromegaly, no lymphadenopathy, no jugular venous distention. Chest: Lungs diminished throughout. CV: Heart sounds are tachycardic, regular. No murmurs, rubs, or gallops. Abdomen: Soft, nontender, nondistended with normal active bowel sounds. Extremities: Extremities are  without clubbing, edema, or cyanosis. Skin: Warm and dry. Neuro: Alert and disoriented to date, month, year but does know president; cranial nerves II through XII grossly intact. Psych: Mood and affect anxious with psychomotor agitation.   Data Review:    Labs: Basic Metabolic Panel:  Recent Labs Lab 12/28/13 0834  NA 137  K 3.2*  CL 96  CO2 22  GLUCOSE 147*  BUN 5*  CREATININE 0.83  CALCIUM 9.8   Liver Function Tests: No results for input(s): AST, ALT, ALKPHOS, BILITOT, PROT, ALBUMIN in the last 168 hours. No results for input(s): LIPASE, AMYLASE in the last 168 hours. No results for input(s): AMMONIA in the last 168 hours. CBC:  Recent Labs Lab 12/28/13 0834  WBC 6.5  NEUTROABS 4.1  HGB 15.4  HCT 44.7  MCV 95.1  PLT 199    Radiographic Studies: Ct Head Wo Contrast  12/28/2013   CLINICAL DATA:  By report, patient trying to withdraw from recent excessive alcohol use and sustained a seizure earlier today associated with headache. Prior history of alcohol withdrawal seizures.  EXAM: CT HEAD WITHOUT CONTRAST  TECHNIQUE: Contiguous axial images were obtained from the base of the skull through the vertex without intravenous contrast.  COMPARISON:  None.  FINDINGS: Moderate cortical and deep atrophy and moderate to severe cerebellar atrophy for age. Severe changes of small vessel disease of the white matter diffusely with old lacunar strokes in the left basal ganglia and right thalamus. No mass lesion. No midline shift. No acute hemorrhage or hematoma. No extra-axial fluid collections. No evidence of acute infarction.  No skull fracture or other focal osseous abnormality involving the skull. Absence of the anterior inferior medial wall of the left maxillary sinus, with soft tissue protruding into the left nasal cavity, with severe mucosal thickening in the left maxillary sinus and thickening of the sinus walls, indicating chronicity. Apparent prior bilateral turbinectomies.  Remaining visualized paranasal sinuses, bilateral mastoid air cells and bilateral middle ear cavities well aerated. Mild bilateral carotid siphon atherosclerosis.  IMPRESSION: 1. No acute intracranial abnormality. 2. Moderate to severe generalized atrophy and severe chronic microvascular ischemic changes of the white matter. 3. 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 4. Severe chronic left maxillary sinusitis. If the patient has not had a prior medial antrostomy, there is erosion of the anterior inferior medial wall of the sinus. Possible mucocele in the left nasal cavity.   Electronically Signed   By: Hulan Saashomas  Lawrence M.D.   On: 12/28/2013 10:24     Assessment/Plan:   Principal Problem:   DTs (delirium tremens) in the setting of a history of alcohol abuse with alcohol withdrawal seizures  Admit to step down unit.  Start Ativan IV per CIWA protocol with thiamine and folic acid supplementation.  Will need psychiatric  consultation when medically stable and able to participate in assessment.  Maintain seizure precautions.  Nothing by mouth until mental status clears and able to protect airway.  Follow-up LFTs which are pending at this time.  Active Problems:   Cerebrovascular disease with history of lacunar strokes and brain atrophy  Continue aspirin and Aricept.    Hypokalemia  Potassium added to IV fluids.  Check magnesium.    Hyperglycemia  Check hemoglobin A1c.    High blood pressure  May be from withdrawal. Monitor trend.  Started on clonidine patch.    DVT prophylaxis  Subcutaneous heparin ordered.  Code Status: Full. Family Communication: No family currently at the bedside. Disposition Plan: Home versus inpatient psychiatric hospital when stable.  Time spent: One hour.  RAMA,CHRISTINA [facility] Pager [phone] Cell: [phone]   If 7PM-7AM, please contact night-coverage [email or web address] Password [email or web address] 12/28/2013, 6:19  PM

## 2013-12-28 NOTE — Progress Notes (Signed)
Called ED at 724pm to get report on pt coming to 2W. Placed on hold for 5+minutes and then was disconnected. Will call back again. Marrion Coyarol Niamh Rada, RN

## 2013-12-28 NOTE — ED Notes (Signed)
Patient transported to CT 

## 2013-12-28 NOTE — ED Notes (Signed)
Patient changed into wine colored scrubs. Patient wanded by security.

## 2013-12-28 NOTE — ED Notes (Signed)
Patient is still unable to urinate 

## 2013-12-28 NOTE — ED Notes (Signed)
Attempted to call report. Receiving nurse discharging a patient and requested that report be called in 10 minutes.

## 2013-12-28 NOTE — ED Notes (Signed)
Bed: WA11 Expected date:  Expected time:  Means of arrival:  Comments: hold 

## 2013-12-28 NOTE — Plan of Care (Signed)
Problem: Consults Goal: General Medical Patient Education See Patient Education Module for specific education. Outcome: Progressing DT's Goal: Skin Care Protocol Initiated - if Braden Score 18 or less If consults are not indicated, leave blank or document N/A Outcome: Progressing Goal: Nutrition Consult-if indicated Outcome: Not Applicable Date Met:  16/10/96 Goal: Diabetes Guidelines if Diabetic/Glucose > 140 If diabetic or lab glucose is > 140 mg/dl - Initiate Diabetes/Hyperglycemia Guidelines & Document Interventions  Outcome: Progressing  Problem: Phase I Progression Outcomes Goal: Pain controlled with appropriate interventions Outcome: Completed/Met Date Met:  12/28/13 Goal: OOB as tolerated unless otherwise ordered Outcome: Progressing Goal: Initial discharge plan identified Outcome: Progressing Goal: Voiding-avoid urinary catheter unless indicated Outcome: Completed/Met Date Met:  12/28/13 Goal: Hemodynamically stable Outcome: Progressing

## 2013-12-28 NOTE — BH Assessment (Signed)
Assessment Note  Derrick Cook is an 58 y.o. male that presents to [facility] via EMS. Patient's fiance at bedside. Writer met with patient to complete a [facility] assessment. Patient was a poor historian and not able to provide a lot of details regarding today's visit. However, patient did report that he has a hx of alcohol withdrawal seizures. He has experienced many seizures in the past. Patient recalls that his last drink was yesterday at 8am. Prior to yesterday patient sts that he had no alcohol in months. He admits to receiving treatment for his alcoholism in the past. Patient not able to recall name of facilities, dates of treatments, etc. He was not able to provide any further details. Patient became drowsy during the assessment. Writer later informed that patient was given Ativan.  Girlfriend was able to provide collateral information. Sts that patient has a long hx of alcoholism. Patient recently moved from [place] to be with his fiance. He lives in the home with his fiance and her 2 children. Since moving to [place] to be with his fiance he has tried to stop drinking. Girlfriend reports that patient is drinking daily but decreasing his use. Girlfriend unable to provide any further details of patient's age of use, durations, amount of use, etc. Girlfriend reports a hx of many prior seizures. Sts that patient has in fact received treatment but it was in a different state.   Patient unable to ascertain whether or not patient is suicidal, homicidal, and/or experiencing any AVH's. Patient is noted as being confused, however; nursing staff believes that this may be due to pt's seizure activity. Depressive and Anxiety symptoms unknown. Appetite and Sleep symptoms unk.   Writer will revisit will patient to obtain additional clinicals once he is alert and oriented.  Axis I: Alcohol Dependency Axis II: Deferred Axis III:  Past Medical History  Diagnosis Date  . Memory problem   . Alcohol abuse   . Alcohol withdrawal  seizure    Axis IV: other psychosocial or environmental problems, problems related to social environment, problems with access to health care services and problems with primary support group Axis V: 31-40 impairment in reality testing  Past Medical History:  Past Medical History  Diagnosis Date  . Memory problem   . Alcohol abuse   . Alcohol withdrawal seizure     Past Surgical History  Procedure Laterality Date  . Back surgery      Family History: History reviewed. No pertinent family history.  Social History:  reports that he has been smoking Cigarettes.  He has been smoking about 0.50 packs per day. He has never used smokeless tobacco. He reports that he drinks alcohol. He reports that he does not use illicit drugs.  Additional Social History:  Alcohol / Drug Use Pain Medications: SEE MAR Prescriptions: SEE MAR Over the Counter: SEE MAR History of alcohol / drug use?: Yes Longest period of sobriety (when/how long): "Several Months" Negative Consequences of Use: Personal relationships Withdrawal Symptoms: DTs Substance #1 Name of Substance 1: Alcohol  1 - Age of First Use: Patient sts, "I can't remember" 1 - Amount (size/oz): Patient unable to recall how much he drinks 1 - Frequency: Patient reports drinking 1x yesterday @ 8am. He does not recall drinking prior to yesterday in several months. Patient is a poor historian as he is very drowsy during the assessment. Per nursing staff patient given Ativan prior to assessment.  1 - Duration: Ongoing  1 - Last Use / Amount: 12/27/2013  CIWA: CIWA-Ar BP: 138/79 mmHg Pulse Rate: 93 Nausea and Vomiting: no nausea and no vomiting Tactile Disturbances: none Tremor: five Auditory Disturbances: moderate harshness or ability to frighten Paroxysmal Sweats: no sweat visible Visual Disturbances: not present Anxiety: three Headache, Fullness in Head: moderate Agitation: somewhat more than normal activity Orientation and Clouding of  Sensorium: cannot do serial additions or is uncertain about date CIWA-Ar Total: 16 COWS:    Allergies: No Known Allergies  Home Medications:  (Not in a hospital admission)  OB/GYN Status:  No LMP for male patient.  General Assessment Data Location of Assessment: [facility] Is this a Tele or Face-to-Face Assessment?: Tele Assessment Is this an Initial Assessment or a Re-assessment for this encounter?: Initial Assessment Living Arrangements: Other (Comment) (lives with fiance and her 2 children; recently moved from [place]) Can pt return to current living arrangement?: Yes Admission Status: Voluntary Is patient capable of signing voluntary admission?: Yes Transfer from: [facility] Referral Source: Self/Family/Friend     [place] Living Arrangements: Other (Comment) (lives with fiance and her 2 children; recently moved from [place]) Name of Psychiatrist:  (No psychiatrist )  Education Status Is patient currently in school?: No  Risk to self with the past 6 months Suicidal Ideation:  (unk; pt did not confirm nor deny; pt drowsy during the asses) Suicidal Intent: No Is patient at risk for suicide?: No Suicidal Plan?: No Access to Means: No What has been your use of drugs/alcohol within the last 12 months?:  (patient reports alchohol used last night; has a hx of abuse) Previous Attempts/Gestures:  (unk; pt did not confirm nor deny) How many times?:  (0) Other Self Harm Risks:  (n/a) Triggers for Past Attempts: Other (Comment) (no previous attempts/gestures reported ) Intentional Self Injurious Behavior: None Family Suicide History: Unknown Recent stressful life event(s): Other (Comment) (does not report any specific stressors; drowsy during assess) Persecutory voices/beliefs?: No Depression: Yes Depression Symptoms: Feeling angry/irritable, Feeling worthless/self pity, Loss of interest in usual pleasures, Guilt, Fatigue, Isolating, Tearfulness, Insomnia,  Despondent Substance abuse history and/or treatment for substance abuse?: No Suicide prevention information given to non-admitted patients: Not applicable  Risk to Others within the past 6 months Homicidal Ideation: No Thoughts of Harm to Others: No Current Homicidal Intent: No Current Homicidal Plan: No Access to Homicidal Means: No Identified Victim:  (n/a) History of harm to others?: No Assessment of Violence: None Noted Violent Behavior Description:  (patient is calm and cooperative ) Does patient have access to weapons?: No Criminal Charges Pending?: No Does patient have a court date: No  Psychosis Hallucinations: None noted Delusions: None noted  Mental Status Report Appear/Hygiene: Disheveled Eye Contact: Good Motor Activity: Freedom of movement Speech: Logical/coherent Level of Consciousness: Alert Mood: Depressed Affect: Appropriate to circumstance Anxiety Level: None Thought Processes: Coherent, Relevant Judgement: Impaired Orientation: Person, Place, Time, Situation Obsessive Compulsive Thoughts/Behaviors: None  Cognitive Functioning Concentration: Decreased Memory: Recent Intact, Remote Intact IQ: Average Insight: Fair Impulse Control: Fair Appetite: Good Weight Loss:  (none reported ) Weight Gain:  (none reported ) Sleep: Unable to Assess Total Hours of Sleep:  (UTA) Vegetative Symptoms: Unable to Assess  ADLScreening [facility] Assessment Services) Patient's cognitive ability adequate to safely complete daily activities?: Yes Patient able to express need for assistance with ADLs?: No Independently performs ADLs?: Yes (appropriate for developmental age)  Prior Inpatient Therapy Prior Inpatient Therapy: Yes Prior Therapy Dates:  (uanble to recall dates ) Prior Therapy Facilty/Provider(s):  (patient unable to recall  names of facilities) Reason for Treatment:  (alcohol (substance abuse))  Prior Outpatient Therapy Prior Outpatient Therapy: No Prior  Therapy Dates:  (n/a) Prior Therapy Facilty/Provider(s):  (n/a) Reason for Treatment:  (n/a)  ADL Screening (condition at time of admission) Patient's cognitive ability adequate to safely complete daily activities?: Yes Is the patient deaf or have difficulty hearing?: No Does the patient have difficulty seeing, even when wearing glasses/contacts?: No Does the patient have difficulty concentrating, remembering, or making decisions?: Yes Patient able to express need for assistance with ADLs?: No Does the patient have difficulty dressing or bathing?: No Independently performs ADLs?: Yes (appropriate for developmental age) Does the patient have difficulty walking or climbing stairs?: No Weakness of Legs: None Weakness of Arms/Hands: None  Home Assistive Devices/Equipment Home Assistive Devices/Equipment: None    Abuse/Neglect Assessment (Assessment to be complete while patient is alone) Physical Abuse: Denies Verbal Abuse: Denies Sexual Abuse: Denies Exploitation of patient/patient's resources: Denies Self-Neglect: Denies Values / Beliefs Cultural Requests During Hospitalization: None Spiritual Requests During Hospitalization: None   Advance Directives (For Healthcare) Does patient have an advance directive?: No Nutrition Screen- [facility] Patient's home diet: Regular  Additional Information 1:1 In Past 12 Months?: No CIRT Risk: No Elopement Risk: No Does patient have medical clearance?: Yes     Disposition:  Disposition Initial Assessment Completed for this Encounter: Yes Disposition of Patient: Other dispositions (Pending psych evaluation to determine further dispositioning)  On Site Evaluation by:   Reviewed with Physician:    Waldon Merl [facility] 12/28/2013 10:02 AM

## 2013-12-28 NOTE — ED Notes (Signed)
Bed: WA10 Expected date:  Expected time:  Means of arrival:  Comments: EMS-SZ 

## 2013-12-29 ENCOUNTER — Encounter (HOSPITAL_COMMUNITY): Payer: Self-pay | Admitting: *Deleted

## 2013-12-29 DIAGNOSIS — I10 Essential (primary) hypertension: Secondary | ICD-10-CM

## 2013-12-29 LAB — COMPREHENSIVE METABOLIC PANEL
ALT: 45 U/L (ref 0–53)
ANION GAP: 19 — AB (ref 5–15)
AST: 59 U/L — ABNORMAL HIGH (ref 0–37)
Albumin: 3.5 g/dL (ref 3.5–5.2)
Alkaline Phosphatase: 92 U/L (ref 39–117)
BILIRUBIN TOTAL: 1 mg/dL (ref 0.3–1.2)
BUN: 8 mg/dL (ref 6–23)
CALCIUM: 9.2 mg/dL (ref 8.4–10.5)
CHLORIDE: 98 meq/L (ref 96–112)
CO2: 21 meq/L (ref 19–32)
CREATININE: 0.83 mg/dL (ref 0.50–1.35)
GLUCOSE: 93 mg/dL (ref 70–99)
Potassium: 3.3 mEq/L — ABNORMAL LOW (ref 3.7–5.3)
Sodium: 138 mEq/L (ref 137–147)
Total Protein: 7.3 g/dL (ref 6.0–8.3)

## 2013-12-29 LAB — MRSA PCR SCREENING: MRSA by PCR: NEGATIVE

## 2013-12-29 LAB — HEMOGLOBIN A1C
Hgb A1c MFr Bld: 5.7 % — ABNORMAL HIGH (ref <5.7)
Mean Plasma Glucose: 117 mg/dL — ABNORMAL HIGH (ref <117)

## 2013-12-29 MED ORDER — LABETALOL HCL 5 MG/ML IV SOLN
20.0000 mg | INTRAVENOUS | Status: DC | PRN
  Administered 2013-12-29 – 2013-12-31 (×11): 20 mg via INTRAVENOUS
  Filled 2013-12-29 (×11): qty 4

## 2013-12-29 MED ORDER — POTASSIUM CHLORIDE CRYS ER 20 MEQ PO TBCR
40.0000 meq | EXTENDED_RELEASE_TABLET | Freq: Once | ORAL | Status: AC
  Administered 2013-12-29: 40 meq via ORAL
  Filled 2013-12-29: qty 2

## 2013-12-29 MED ORDER — LORAZEPAM 2 MG/ML IJ SOLN
2.0000 mg | Freq: Once | INTRAMUSCULAR | Status: AC
  Administered 2013-12-29: 2 mg via INTRAVENOUS
  Filled 2013-12-29: qty 1

## 2013-12-29 MED ORDER — CETYLPYRIDINIUM CHLORIDE 0.05 % MT LIQD
7.0000 mL | Freq: Two times a day (BID) | OROMUCOSAL | Status: DC
  Administered 2013-12-29 – 2014-01-02 (×6): 7 mL via OROMUCOSAL

## 2013-12-29 MED ORDER — CHLORHEXIDINE GLUCONATE 0.12 % MT SOLN
15.0000 mL | Freq: Two times a day (BID) | OROMUCOSAL | Status: DC
  Administered 2013-12-29 – 2014-01-03 (×9): 15 mL via OROMUCOSAL
  Filled 2013-12-29 (×12): qty 15

## 2013-12-29 MED ORDER — MAGNESIUM SULFATE 4 GM/100ML IV SOLN
4.0000 g | Freq: Once | INTRAVENOUS | Status: AC
  Administered 2013-12-29: 4 g via INTRAVENOUS
  Filled 2013-12-29: qty 100

## 2013-12-29 NOTE — Progress Notes (Signed)
Pt with increased agitation. Attempting to get out of bed. Called midlevel awaiting call back.

## 2013-12-29 NOTE — Plan of Care (Signed)
Problem: Phase I Progression Outcomes Goal: OOB as tolerated unless otherwise ordered Outcome: Progressing     

## 2013-12-29 NOTE — Progress Notes (Addendum)
Progress Note   Derrick Cook EAV:[id number] DOB: 11/15/1955 DOA: 12/28/2013 PCP: No primary care provider on file.   Brief Narrative:   Derrick Cook is an 58 y.o. male with a PMH of ETOH abuse, history of alcohol withdrawal seizures with a seizure noted 12/28/13, who was admitted 12/28/13 secondary to the development of DTs with prominent tremulousness, confusion and disorientation.  Assessment/Plan:   Principal Problem:  DTs (delirium tremens) in the setting of a history of alcohol abuse with alcohol withdrawal seizures  Admitted to step down unit.  Continue Ativan IV per CIWA protocol with thiamine and folic acid supplementation.  Will need psychiatric consultation when medically stable when able to participate in assessment.  Maintain seizure precautions.  Diet advanced.  LFTs stable.  Active Problems:  Cerebrovascular disease with history of lacunar strokes and brain atrophy  Continue aspirin and Aricept.   Hypokalemia / hypomagnesemia  Potassium added to IV fluids. We'll give 40 mEq of oral replacement therapy today.  We'll give 4 g of IV magnesium today.  Follow-up potassium/magnesium in the morning.   Hyperglycemia  Hemoglobin A1c 5.7%, indicating the patient is at increased risk of developing diabetes with a mean plasma glucose of 117.   High blood pressure  May be from withdrawal. Hypertensive despite clonidine patch.  Started on clonidine patch.  Add PRN labetalol.   DVT prophylaxis  Subcutaneous heparin ordered.  Code Status: Full. Family Communication: Significant other updated at the bedside. Disposition Plan: Home versus inpatient psychiatric hospital when stable.   IV Access:    Peripheral IV   Procedures and diagnostic studies:   Ct Head Wo Contrast 12/28/2013: 1. No acute intracranial abnormality. 2. Moderate to severe generalized atrophy and severe chronic microvascular ischemic changes of the white matter. 3. Old lacunar  strokes in the left basal ganglia and right thalamus. 4. Severe chronic left maxillary sinusitis. If the patient has not had a prior medial antrostomy, there is erosion of the anterior inferior medial wall of the sinus. Possible mucocele in the left nasal cavity.    Medical Consultants:    None.  Anti-Infectives:    None.  Subjective:   Derrick Cook still very confused and tremulous. He is calm and attempts to answer questions but answers are somewhat tangential. He remains disoriented. Significant other at the bedside endorses that he drinks vodka starting at 5 AM.  Objective:    Filed Vitals:   12/29/13 0400 12/29/13 0500 12/29/13 0600 12/29/13 0800  BP: 170/115 169/133 154/114   Pulse: 81 95    Temp: 98.8 F (37.1 C)   98.8 F (37.1 C)  TempSrc: Oral   Axillary  Resp: 23 25    Height:      Weight:      SpO2: 97% 97%      Intake/Output Summary (Last 24 hours) at 12/29/13 0946 Last data filed at 12/29/13 0300  Gross per 24 hour  Intake    830 ml  Output    400 ml  Net    430 ml    Exam: Gen:  Restless and tremulous  Cardiovascular:  Tachycardic, regular No M/R/G Respiratory:  Lungs diminished but clear Gastrointestinal:  Abdomen soft, NT/ND, + BS Extremities:  No C/E/C   Data Reviewed:    Labs: Basic Metabolic Panel:  Recent Labs Lab 12/28/13 0832 12/28/13 0834 12/28/13 2116 12/29/13 0810  NA 138 137  --  138  K 3.6* 3.2*  --  3.3*  CL 95* 96  --  98  CO2 18* 22  --  21  GLUCOSE 136* 147*  --  93  BUN 6 5*  --  8  CREATININE 0.84 0.83 0.80 0.83  CALCIUM 10.1 9.8  --  9.2  MG 1.4*  --   --   --    GFR Estimated Creatinine Clearance: 117.4 mL/min (by C-G formula based on Cr of 0.83). Liver Function Tests:  Recent Labs Lab 12/29/13 0810  AST 59*  ALT 45  ALKPHOS 92  BILITOT 1.0  PROT 7.3  ALBUMIN 3.5   CBC:  Recent Labs Lab 12/28/13 0834  WBC 6.5  NEUTROABS 4.1  HGB 15.4  HCT 44.7  MCV 95.1  PLT 199   Hgb A1c:  Recent  Labs  12/28/13 0832  HGBA1C 5.7*   Microbiology Recent Results (from the past 240 hour(s))  MRSA PCR Screening     Status: None   Collection Time: 12/28/13  8:50 PM  Result Value Ref Range Status   MRSA by PCR NEGATIVE NEGATIVE Final    Comment:        The GeneXpert MRSA Assay (FDA approved for NASAL specimens only), is one component of a comprehensive MRSA colonization surveillance program. It is not intended to diagnose MRSA infection nor to guide or monitor treatment for MRSA infections.      Medications:   . aspirin EC  81 mg Oral QPM  . cloNIDine  0.2 mg Transdermal Weekly  . donepezil  10 mg Oral QHS  . folic acid  1 mg Oral Daily  . heparin  5,000 Units Subcutaneous 3 times per day  . LORazepam  0-4 mg Intravenous Q6H   Followed by  . [START ON 12/30/2013] LORazepam  0-4 mg Intravenous Q12H  . magnesium sulfate 1 - 4 g bolus IVPB  4 g Intravenous Once  . multivitamin with minerals  1 tablet Oral Daily  . nicotine  14 mg Transdermal Daily  . sodium chloride  3 mL Intravenous Q12H  . thiamine  100 mg Oral Daily   Or  . thiamine  100 mg Intravenous Daily   Continuous Infusions: . 0.9 % NaCl with KCl 40 mEq / L 75 mL/hr (12/28/13 2056)    Time spent: 35 minutes with > 50% of time discussing current diagnostic test results, clinical impression and plan of care.   LOS: 1 day   RAMA,CHRISTINA  [facility] Pager [phone]. If unable to reach me by pager, please call my cell phone at [phone].  *Please refer to [email or web address], password [email or web address] to get updated schedule on who will round on this patient, as hospitalists switch teams weekly. If 7PM-7AM, please contact night-coverage at [email or web address], password [email or web address] for any overnight needs.  12/29/2013, 9:46 AM

## 2013-12-29 NOTE — Progress Notes (Signed)
Patient's IV was leaking this morning when 1mg  of Ativan was given IV.  The patient was still very agitated 15 minutes after administration because the ativan had leaked out of the IV tubing and had not reached the him.  The tubing connection was fixed and the 1mg  of Ativan was delivered to the patient who is now resting comfortably.

## 2013-12-30 LAB — BASIC METABOLIC PANEL
Anion gap: 17 — ABNORMAL HIGH (ref 5–15)
BUN: 6 mg/dL (ref 6–23)
CO2: 20 mEq/L (ref 19–32)
Calcium: 9.4 mg/dL (ref 8.4–10.5)
Chloride: 99 mEq/L (ref 96–112)
Creatinine, Ser: 0.84 mg/dL (ref 0.50–1.35)
GFR calc non Af Amer: >90 mL/min (ref >90)
Glucose, Bld: 109 mg/dL — ABNORMAL HIGH (ref 70–99)
POTASSIUM: 3.6 meq/L — AB (ref 3.7–5.3)
SODIUM: 136 meq/L — AB (ref 137–147)

## 2013-12-30 LAB — MAGNESIUM: MAGNESIUM: 1.7 mg/dL (ref 1.5–2.5)

## 2013-12-30 MED ORDER — LORAZEPAM 2 MG/ML IJ SOLN
2.0000 mg | Freq: Once | INTRAMUSCULAR | Status: AC
  Administered 2013-12-30: 2 mg via INTRAVENOUS

## 2013-12-30 MED ORDER — LORAZEPAM 2 MG/ML IJ SOLN
2.0000 mg | Freq: Once | INTRAMUSCULAR | Status: AC
  Administered 2013-12-30: 2 mg via INTRAVENOUS
  Filled 2013-12-30: qty 1

## 2013-12-30 MED ORDER — LORAZEPAM 2 MG/ML IJ SOLN
1.0000 mg | INTRAMUSCULAR | Status: DC | PRN
  Administered 2013-12-31: 1 mg via INTRAVENOUS
  Filled 2013-12-30: qty 1

## 2013-12-30 MED ORDER — HYDRALAZINE HCL 20 MG/ML IJ SOLN
10.0000 mg | Freq: Once | INTRAMUSCULAR | Status: AC
  Administered 2013-12-30: 10 mg via INTRAVENOUS

## 2013-12-30 MED ORDER — DEXMEDETOMIDINE HCL IN NACL 200 MCG/50ML IV SOLN
0.4000 ug/kg/h | INTRAVENOUS | Status: DC
  Administered 2013-12-30: 0.4 ug/kg/h via INTRAVENOUS
  Administered 2013-12-30: 0.5 ug/kg/h via INTRAVENOUS
  Administered 2013-12-30: 0.6 ug/kg/h via INTRAVENOUS
  Administered 2013-12-30 – 2013-12-31 (×3): 0.4 ug/kg/h via INTRAVENOUS
  Filled 2013-12-30 (×5): qty 50

## 2013-12-30 MED ORDER — HYDRALAZINE HCL 20 MG/ML IJ SOLN
5.0000 mg | Freq: Once | INTRAMUSCULAR | Status: AC
  Administered 2013-12-30: 5 mg via INTRAVENOUS
  Filled 2013-12-30: qty 1

## 2013-12-30 MED ORDER — HYDRALAZINE HCL 20 MG/ML IJ SOLN
INTRAMUSCULAR | Status: AC
  Filled 2013-12-30: qty 1

## 2013-12-30 NOTE — Progress Notes (Signed)
Patient ID: Derrick Cook, male   DOB: 07/15/1955, 58 y.o.   MRN: [id number] [facility] PROGRESS NOTE  Derrick Cook WUJ:[id number] DOB: 01/09/1956 DOA: 12/28/2013 PCP: No primary care provider on file.  Brief narrative:    58 y.o. male with a PMH of severe ETOH abuse, history of alcohol withdrawal seizures with a seizure noted 12/28/13, who was admitted 12/28/13 secondary to the development of DTs with prominent tremulousness, confusion and disorientation.  Assessment/Plan:    Principal Problem:  DTs (delirium tremens) in the setting of a history of alcohol abuse with alcohol withdrawal seizures / Acute encephalopathy  - still tremulous but no seizures reported - CT head on admission showed no acute intracranial findings  - will continue CIWA protocol, still at high risk of withdrawals and delirium tremens  - seizure precaution, withdrawal precaution  - keep in SDU  Active Problems:  Cerebrovascular disease with history of lacunar strokes and brain atrophy - Continue aspirin and Aricept.   Hypokalemia / hypomagnesemia - Potassium aggressively repleted through IV fluids - magnesium repleted and this morning 1.7.   Hyperglycemia - Hemoglobin A1c 5.7%, indicating the patient is at increased risk of developing diabetes - keep NPO until delirium improves    Hypertension  - secondary to withdrawals - has clonidine patch    DVT prophylaxis - Subcutaneous heparin ordered.  Code Status: Full. Family Communication: Significant other updated at the bedside. Disposition Plan: remaining in SDU due to high CIWA score    IV access:   Peripheral IV  Procedures and diagnostic studies:    Ct Head Wo Contrast 12/28/2013  1. No acute intracranial abnormality. 2. Moderate to severe generalized atrophy and severe chronic microvascular ischemic changes of the white matter. 3. 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 4. Severe chronic left maxillary  sinusitis. If the patient has not had a prior medial antrostomy, there is erosion of the anterior inferior medial wall of the sinus. Possible mucocele in the left nasal cavity.   Medical Consultants:   None   Other Consultants:   None   IAnti-Infectives:    None   Manson PasseyEVINE, Treasa Bradshaw, MD  [facility] Pager [phone]  If 7PM-7AM, please contact night-coverage [email or web address] Password [email or web address] 12/30/2013, 8:35 AM   LOS: 2 days    HPI/Subjective: No acute overnight events.  Objective: Filed Vitals:   12/30/13 0330 12/30/13 0400 12/30/13 0500 12/30/13 0800  BP:  187/103 159/101   Pulse: 67 106 114   Temp:  99.5 F (37.5 C)  98.2 F (36.8 C)  TempSrc:  Oral  Oral  Resp: 26 24 25    Height:      Weight:  85.4 kg (188 lb 4.4 oz)    SpO2: 95% 95% 97%     Intake/Output Summary (Last 24 hours) at 12/30/13 0835 Last data filed at 12/30/13 0514  Gross per 24 hour  Intake   1600 ml  Output   1300 ml  Net    300 ml    Exam:   General:  Pt is tremulous, altered mentation   Cardiovascular: tachycardic, S1/S2, no murmurs  Respiratory: Clear to auscultation bilaterally, no wheezing, no crackles, no rhonchi  Abdomen: Soft, non tender, non distended, bowel sounds present  Extremities: No edema, pulses DP and PT palpable bilaterally  Neuro: Grossly nonfocal  Data Reviewed: Basic Metabolic Panel:  Recent Labs Lab 12/28/13 0832 12/28/13 0834 12/28/13 2116 12/29/13 0810 12/30/13 0355  NA 138 137  --  138 136*  K 3.6* 3.2*  --  3.3* 3.6*  CL 95* 96  --  98 99  CO2 18* 22  --  21 20  GLUCOSE 136* 147*  --  93 109*  BUN 6 5*  --  8 6  CREATININE 0.84 0.83 0.80 0.83 0.84  CALCIUM 10.1 9.8  --  9.2 9.4  MG 1.4*  --   --   --  1.7   Liver Function Tests:  Recent Labs Lab 12/29/13 0810  AST 59*  ALT 45  ALKPHOS 92  BILITOT 1.0  PROT 7.3  ALBUMIN 3.5   No results for input(s): LIPASE, AMYLASE in the last 168 hours. No results for input(s): AMMONIA in the  last 168 hours. CBC:  Recent Labs Lab 12/28/13 0834  WBC 6.5  NEUTROABS 4.1  HGB 15.4  HCT 44.7  MCV 95.1  PLT 199   Cardiac Enzymes: No results for input(s): CKTOTAL, CKMB, CKMBINDEX, TROPONINI in the last 168 hours. BNP: Invalid input(s): POCBNP CBG: No results for input(s): GLUCAP in the last 168 hours.  Recent Results (from the past 240 hour(s))  MRSA PCR Screening     Status: None   Collection Time: 12/28/13  8:50 PM  Result Value Ref Range Status   MRSA by PCR NEGATIVE NEGATIVE Final    Comment:           Scheduled Meds: . aspirin EC  81 mg Oral QPM  . cloNIDine  0.2 mg Transdermal Weekly  . donepezil  10 mg Oral QHS  . folic acid  1 mg Oral Daily  . heparin  5,000 Units Subcutaneous 3 times per day  . LORazepam  0-4 mg Intravenous Q6H   Followed by  . LORazepam  0-4 mg Intravenous Q12H  . multivitamin   1 tablet Oral Daily  . nicotine  14 mg Transdermal Daily  . thiamine  100 mg Intravenous Daily   Continuous Infusions: . 0.9 % NaCl with KCl 40 mEq / L 75 mL/hr (12/30/13 0227)

## 2013-12-30 NOTE — Progress Notes (Signed)
Patient's monitor reading out ST increase. Captured EKG and reviewed with Consulting civil engineerCharge RN. Did not think required action; will continue to monitor.

## 2013-12-30 NOTE — Progress Notes (Signed)
Pts hands clenched tight to wife's as if he is frighten and tense not easy to be consoled. Wife reported that pt was talking about ghosts. Pt has mumbled and had disorganized thoughts that he voices, occasionally repeats what you say to him oppose to following simple commands. Brow is furrowed as if in pain,  however pt somewhat denies pain when asked by wife. Unable to get a clear understanding to if he is in pain or not because when you attempt to do vitals or any therapeutic procedures pt is aggressive and strong to restrict what is being done. Had to place blood pressure cuff on his leg to try to obtain a more accurate BP and prevent pt from trying to pull at the wires. Has had lights down and attempted to keep a calm environment for the pt. MD is aware of pt condition.

## 2013-12-30 NOTE — Progress Notes (Signed)
Pt continue to have the tremors, agitation, anxiety, confusion, elevated bps; MD is aware of the elevated CIWA scale. Medication administered per the scale and with MD advisal. Pt wife RN at bedside aware of pt condition and agreeable with treatment plans at this time. Wife supportive to pt and nursing staff.

## 2013-12-30 NOTE — Progress Notes (Signed)
Pt with b/p elevated have given labetolol about every 2 hours no significant change. Midlevel paged awaiting call back.

## 2013-12-31 LAB — BASIC METABOLIC PANEL
ANION GAP: 16 — AB (ref 5–15)
BUN: 8 mg/dL (ref 6–23)
CO2: 20 mEq/L (ref 19–32)
Calcium: 9.7 mg/dL (ref 8.4–10.5)
Chloride: 104 mEq/L (ref 96–112)
Creatinine, Ser: 0.82 mg/dL (ref 0.50–1.35)
GFR calc non Af Amer: >90 mL/min (ref >90)
GLUCOSE: 112 mg/dL — AB (ref 70–99)
POTASSIUM: 4.1 meq/L (ref 3.7–5.3)
Sodium: 140 mEq/L (ref 137–147)

## 2013-12-31 LAB — CBC
HCT: 44.9 % (ref 39.0–52.0)
Hemoglobin: 14.6 g/dL (ref 13.0–17.0)
MCH: 31.9 pg (ref 26.0–34.0)
MCHC: 32.5 g/dL (ref 30.0–36.0)
MCV: 98 fL (ref 78.0–100.0)
PLATELETS: 216 10*3/uL (ref 150–400)
RBC: 4.58 MIL/uL (ref 4.22–5.81)
RDW: 13.6 % (ref 11.5–15.5)
WBC: 10 10*3/uL (ref 4.0–10.5)

## 2013-12-31 LAB — MAGNESIUM: Magnesium: 1.8 mg/dL (ref 1.5–2.5)

## 2013-12-31 MED ORDER — POTASSIUM CHLORIDE IN NACL 40-0.9 MEQ/L-% IV SOLN
INTRAVENOUS | Status: DC
  Administered 2013-12-31: 50 mL/h via INTRAVENOUS
  Filled 2013-12-31: qty 1000

## 2013-12-31 MED ORDER — LORAZEPAM 2 MG/ML IJ SOLN
1.0000 mg | INTRAMUSCULAR | Status: DC | PRN
  Administered 2014-01-01 (×4): 2 mg via INTRAVENOUS
  Administered 2014-01-03: 1 mg via INTRAVENOUS
  Administered 2014-01-03: 2 mg via INTRAVENOUS
  Filled 2013-12-31 (×7): qty 1

## 2013-12-31 MED ORDER — LORAZEPAM 2 MG/ML IJ SOLN
1.0000 mg | Freq: Once | INTRAMUSCULAR | Status: AC
  Administered 2013-12-31: 1 mg via INTRAVENOUS
  Filled 2013-12-31: qty 1

## 2013-12-31 MED ORDER — HYDRALAZINE HCL 20 MG/ML IJ SOLN
10.0000 mg | Freq: Four times a day (QID) | INTRAMUSCULAR | Status: DC
  Administered 2013-12-31 – 2014-01-03 (×8): 10 mg via INTRAVENOUS
  Filled 2013-12-31: qty 1
  Filled 2013-12-31: qty 0.5
  Filled 2013-12-31: qty 1
  Filled 2013-12-31: qty 0.5
  Filled 2013-12-31 (×3): qty 1
  Filled 2013-12-31 (×5): qty 0.5
  Filled 2013-12-31: qty 1
  Filled 2013-12-31: qty 0.5
  Filled 2013-12-31 (×2): qty 1

## 2013-12-31 MED ORDER — SODIUM CHLORIDE 0.9 % IV SOLN
INTRAVENOUS | Status: DC
  Administered 2013-12-31: 22:00:00 via INTRAVENOUS

## 2013-12-31 MED ORDER — ACETAMINOPHEN 650 MG RE SUPP: 650.0000 mg | Freq: Four times a day (QID) | RECTAL | Status: DC | PRN

## 2013-12-31 MED ORDER — ACETAMINOPHEN 325 MG PO TABS
650.0000 mg | ORAL_TABLET | Freq: Four times a day (QID) | ORAL | Status: DC | PRN
  Administered 2013-12-31 – 2014-01-01 (×2): 650 mg via ORAL
  Filled 2013-12-31 (×2): qty 2

## 2013-12-31 MED ORDER — HYDRALAZINE HCL 20 MG/ML IJ SOLN
10.0000 mg | Freq: Four times a day (QID) | INTRAMUSCULAR | Status: DC | PRN
  Administered 2013-12-31: 10 mg via INTRAVENOUS
  Filled 2013-12-31 (×3): qty 1

## 2013-12-31 NOTE — Progress Notes (Addendum)
Patient ID: Derrick Cook Netzer, male   DOB: 09/22/1955, 58 y.o.   MRN: [id number] [facility] PROGRESS NOTE  Derrick Cook Wubben WUJ:[id number] DOB: 02/15/1955 DOA: 12/28/2013 PCP: No primary care provider on file.  Brief narrative:    58 y.o. male with a PMH of severe ETOH abuse, history of alcohol withdrawal seizures with a seizure noted 12/28/13, who was admitted 12/28/13 secondary to the development of DTs with prominent tremulousness, confusion and disorientation.  Assessment/Plan:    Principal Problem:  DTs (delirium tremens) in the setting of a history of alcohol abuse with alcohol withdrawal seizures / Acute encephalopathy  - pt required precedex drip to be started 12/30/2013 due to more restlessness, agitation - we will continue supportive care in SDU while pt on precedex drip, titrate to RASS of 0. Mental status better this am. - CT head on admission showed no acute intracranial findings  - seizure precaution, withdrawal precaution  - keep in SDU  Active Problems:  Cerebrovascular disease with history of lacunar strokes and brain atrophy - Continue aspirin and Aricept.   Hypokalemia / hypomagnesemia - Potassium aggressively repleted through IV fluids. Potassium this morning WNL, magnesium 1.8, WNL.   Hyperglycemia - Hemoglobin A1c 5.7%, indicating the patient is at increased risk of developing diabetes. - will counsel on diet once pt able to participate in conversation    Hypertension  - secondary to withdrawals - has clonidine patch  - add hydralazine to the regimen 10 mg IV every 6 hours and PRN if BP still above 150/90   DVT prophylaxis - Subcutaneous heparin ordered.  Code Status: Full. Family Communication: Significant other updated at the bedside. Disposition Plan: remaining in SDU while on precedex drip     IV access:   Peripheral IV  Procedures and diagnostic studies:    Ct Head Wo Contrast 12/28/2013 1. No acute intracranial abnormality. 2. Moderate  to severe generalized atrophy and severe chronic microvascular ischemic changes of the white matter. 3. 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 4. Severe chronic left maxillary sinusitis. If the patient has not had a prior medial antrostomy, there is erosion of the anterior inferior medial wall of the sinus. Possible mucocele in the left nasal cavity  Medical Consultants:   None  Other Consultants:   None  IAnti-Infectives:    None    Manson PasseyEVINE, Carry Ortez, MD  [facility] Pager [phone]  If 7PM-7AM, please contact night-coverage [email or web address] Password [email or web address] 12/31/2013, 6:42 AM   LOS: 3 days    HPI/Subjective: No acute overnight events.  Objective: Filed Vitals:   12/31/13 0200 12/31/13 0300 12/31/13 0349 12/31/13 0400  BP: 181/101 187/91  186/90  Pulse: 65 66  77  Temp:   98.2 F (36.8 C)   TempSrc:   Oral   Resp: 16 17  16   Height:      Weight:      SpO2: 96% 93%  95%    Intake/Output Summary (Last 24 hours) at 12/31/13 [id number] Last data filed at 12/31/13 0600  Gross per 24 hour  Intake 1189.91 ml  Output   2100 ml  Net -910.09 ml    Exam:   General:  Pt is alert, follows commands appropriately, not in acute distress  Cardiovascular: Regular rate and rhythm, S1/S2, no murmurs  Respiratory: Clear to auscultation bilaterally, no wheezing, no crackles, no rhonchi  Abdomen: Soft, non tender, non distended, bowel sounds present  Extremities: No edema, pulses DP and PT palpable bilaterally  Neuro: Grossly  nonfocal  Data Reviewed: Basic Metabolic Panel:  Recent Labs Lab 12/28/13 0832 12/28/13 0834 12/28/13 2116 12/29/13 0810 12/30/13 0355 12/31/13 0350  NA 138 137  --  138 136* 140  K 3.6* 3.2*  --  3.3* 3.6* 4.1  CL 95* 96  --  98 99 104  CO2 18* 22  --  21 20 20   GLUCOSE 136* 147*  --  93 109* 112*  BUN 6 5*  --  8 6 8   CREATININE 0.84 0.83 0.80 0.83 0.84 0.82  CALCIUM 10.1 9.8  --  9.2 9.4 9.7  MG 1.4*  --   --   --   1.7 1.8   Liver Function Tests:  Recent Labs Lab 12/29/13 0810  AST 59*  ALT 45  ALKPHOS 92  BILITOT 1.0  PROT 7.3  ALBUMIN 3.5   No results for input(s): LIPASE, AMYLASE in the last 168 hours. No results for input(s): AMMONIA in the last 168 hours. CBC:  Recent Labs Lab 12/28/13 0834 12/31/13 0350  WBC 6.5 10.0  NEUTROABS 4.1  --   HGB 15.4 14.6  HCT 44.7 44.9  MCV 95.1 98.0  PLT 199 216   Cardiac Enzymes: No results for input(s): CKTOTAL, CKMB, CKMBINDEX, TROPONINI in the last 168 hours. BNP: Invalid input(s): POCBNP CBG: No results for input(s): GLUCAP in the last 168 hours.  Recent Results (from the past 240 hour(s))  MRSA PCR Screening     Status: None   Collection Time: 12/28/13  8:50 PM  Result Value Ref Range Status   MRSA by PCR NEGATIVE NEGATIVE Final    Comment:           Scheduled Meds: . aspirin EC  81 mg Oral QPM  . cloNIDine  0.2 mg Transdermal Weekly  . donepezil  10 mg Oral QHS  . folic acid  1 mg Oral Daily  . heparin  5,000 Units Subcutaneous 3 times per day  . multivitamin with minerals  1 tablet Oral Daily  . nicotine  14 mg Transdermal Daily  . thiamine  100 mg Intravenous Daily   Continuous Infusions: . 0.9 % NaCl with KCl 40 mEq / L 75 mL/hr (12/30/13 1900)  . dexmedetomidine 0.4 mcg/kg/hr (12/31/13 0600)

## 2014-01-01 MED ORDER — IBUPROFEN 200 MG PO TABS
400.0000 mg | ORAL_TABLET | Freq: Once | ORAL | Status: AC
  Administered 2014-01-01: 400 mg via ORAL
  Filled 2014-01-01: qty 2

## 2014-01-01 MED ORDER — DOCUSATE SODIUM 100 MG PO CAPS
100.0000 mg | ORAL_CAPSULE | Freq: Two times a day (BID) | ORAL | Status: DC
  Administered 2014-01-01 – 2014-01-02 (×4): 100 mg via ORAL
  Filled 2014-01-01 (×7): qty 1

## 2014-01-01 MED ORDER — IBUPROFEN 200 MG PO TABS
200.0000 mg | ORAL_TABLET | Freq: Four times a day (QID) | ORAL | Status: DC | PRN
  Administered 2014-01-02 – 2014-01-03 (×2): 200 mg via ORAL
  Filled 2014-01-01 (×2): qty 1

## 2014-01-01 NOTE — Progress Notes (Signed)
Patient ID: Derrick Cook Durbin, male   DOB: 08/13/1955, 58 y.o.   MRN: [id number] [facility] PROGRESS NOTE  Derrick Cook Tardiff [id number] DOB: 12/03/1955 DOA: 12/28/2013 PCP: No primary care provider on file.  Brief narrative:    58 y.o. male with a PMH of severe ETOH abuse, history of alcohol withdrawal seizures with a seizure noted 12/28/13, who was admitted 12/28/13 secondary to the development of DTs with prominent tremulousness, confusion and disorientation.   Assessment/Plan:     Principal Problem:  DTs (delirium tremens) in the setting of a history of alcohol abuse with alcohol withdrawal seizures / Acute encephalopathy  - pt required precedex drip to be started 12/30/2013 due to more restlessness, agitation. No w much better and precedex weaned off - will continue ativan 2 mg IV every 4 hours as needed for agitation, withdrawals - no reports of withdrawals so far  - CT head on admission showed no acute intracranial findings  - transfer to telemetry floor today  Active Problems:  Cerebrovascular disease with history of lacunar strokes and brain atrophy - Continue aspirin and Aricept.   Hypokalemia / hypomagnesemia - Potassium repleted through IV fluids. Potassium WNL. Magnesium level WNL   Hyperglycemia - Hemoglobin A1c 5.7%, indicating the patient is at increased risk of developing diabetes. - counsel on diet    Hypertension  - secondary to withdrawals - has clonidine patch  - added hydralazine to the regimen 10 mg IV every 6 hours and PRN if BP still above 150/90   DVT prophylaxis - Subcutaneous heparin ordered.  Code Status: Full. Family Communication: Significant other updated at the bedside. Disposition Plan: transfer to telemetry floor todaay    IV access:   Peripheral IV  Procedures and diagnostic studies:    Ct Head Wo Contrast 12/28/2013 1. No acute intracranial abnormality. 2. Moderate to severe generalized atrophy and severe chronic  microvascular ischemic changes of the white matter. 3. 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 4. Severe chronic left maxillary sinusitis. If the patient has not had a prior medial antrostomy, there is erosion of the anterior inferior medial wall of the sinus. Possible mucocele in the left nasal cavity  Medical Consultants:   None  Other Consultants:   None  IAnti-Infectives:    None    Manson PasseyEVINE, Nadeen Shipman, MD  [facility] Pager [phone]  If 7PM-7AM, please contact night-coverage [email or web address] Password [email or web address] 01/01/2014, 7:42 AM   LOS: 4 days    HPI/Subjective: No acute overnight events.  Objective: Filed Vitals:   01/01/14 0000 01/01/14 0400 01/01/14 0600 01/01/14 0632  BP: 132/83 145/85  144/106  Pulse: 125 117  107  Temp:      TempSrc:      Resp: 21 18  19   Height:      Weight:   83.6 kg (184 lb 4.9 oz)   SpO2: 100% 98%  98%    Intake/Output Summary (Last 24 hours) at 01/01/14 [id number] Last data filed at 01/01/14 0600  Gross per 24 hour  Intake   1531 ml  Output   2075 ml  Net   -544 ml    Exam:   General:  Pt is more alert this am, no distress  Cardiovascular: Regular rate and rhythm, S1/S2, no murmurs  Respiratory: Clear to auscultation bilaterally, no wheezing, no crackles, no rhonchi  Abdomen: Soft, non tender, non distended, bowel sounds present  Extremities: No edema, pulses DP and PT palpable bilaterally  Neuro: Grossly nonfocal; LE weakness  Data Reviewed: Basic Metabolic Panel:  Recent Labs Lab 12/28/13 0832 12/28/13 0834 12/28/13 2116 12/29/13 0810 12/30/13 0355 12/31/13 0350  NA 138 137  --  138 136* 140  K 3.6* 3.2*  --  3.3* 3.6* 4.1  CL 95* 96  --  98 99 104  CO2 18* 22  --  21 20 20   GLUCOSE 136* 147*  --  93 109* 112*  BUN 6 5*  --  8 6 8   CREATININE 0.84 0.83 0.80 0.83 0.84 0.82  CALCIUM 10.1 9.8  --  9.2 9.4 9.7  MG 1.4*  --   --   --  1.7 1.8   Liver Function Tests:  Recent Labs Lab  12/29/13 0810  AST 59*  ALT 45  ALKPHOS 92  BILITOT 1.0  PROT 7.3  ALBUMIN 3.5   No results for input(s): LIPASE, AMYLASE in the last 168 hours. No results for input(s): AMMONIA in the last 168 hours. CBC:  Recent Labs Lab 12/28/13 0834 12/31/13 0350  WBC 6.5 10.0  NEUTROABS 4.1  --   HGB 15.4 14.6  HCT 44.7 44.9  MCV 95.1 98.0  PLT 199 216   Cardiac Enzymes: No results for input(s): CKTOTAL, CKMB, CKMBINDEX, TROPONINI in the last 168 hours. BNP: Invalid input(s): POCBNP CBG: No results for input(s): GLUCAP in the last 168 hours.  Recent Results (from the past 240 hour(s))  MRSA PCR Screening     Status: None   Collection Time: 12/28/13  8:50 PM  Result Value Ref Range Status   MRSA by PCR NEGATIVE NEGATIVE Final    Comment:           Scheduled Meds: . aspirin EC  81 mg Oral QPM  . cloNIDine  0.2 mg Transdermal Weekly  . donepezil  10 mg Oral QHS  . folic acid  1 mg Oral Daily  . heparin  5,000 Units Subcutaneous 3 times per day  . hydrALAZINE  10 mg Intravenous Q6H  . multivitamin with minerals  1 tablet Oral Daily  . nicotine  14 mg Transdermal Daily  . thiamine  100 mg Oral Daily   Or  . thiamine  100 mg Intravenous Daily   Continuous Infusions: . sodium chloride 50 mL/hr at 12/31/13 2224  . dexmedetomidine Stopped (12/31/13 1300)

## 2014-01-01 NOTE — Progress Notes (Signed)
Writer has given report to receiving nurse, Tess. Have transported pt to room 1432.

## 2014-01-01 NOTE — Progress Notes (Signed)
Awaiting call back from 4th floor nurse for transfer report. Writer just called.

## 2014-01-01 NOTE — Progress Notes (Signed)
Pt's "fiance" is worried about pt's coordination. (hand-eye) Pt has difficulty trying to feed himself; and move his legs for ambulation. PT to work with pt this morning.

## 2014-01-01 NOTE — Evaluation (Signed)
Physical Therapy Evaluation Patient Details Name: Derrick Cook Krehbiel MRN: [id number] DOB: 09/10/1955 Today's Date: 01/01/2014   History of Present Illness  Derrick Cook Costin is an 58 y.o. male with a PMH of ETOH abuse, history of alcohol withdrawal seizures with a seizure noted AM of admission on 12/28/13.Admitted secondary  to the development of DTs with prominent tremulousness  Clinical Impression  Patient presents with significant dysfunctional gait  During eval, AMS persists.Per significant other, patient will be home alone during nights. Patient will benefit from PT to address problems listed in note below. recommend OT consult/    Follow Up Recommendations SNF;Supervision/Assistance - 24 hour    Equipment Recommendations  Rolling walker with 5" wheels    Recommendations for Other Services OT consult     Precautions / Restrictions Precautions Precautions: Fall      Mobility  Bed Mobility               General bed mobility comments: in recliner  Transfers Overall transfer level: Needs assistance Equipment used: Rolling walker (2 wheeled) Transfers: Sit to/from Stand Sit to Stand: Max assist;+2 physical assistance;+2 safety/equipment         General transfer comment: multimodal cues  for hand palcement on recliner to stand from recliner, x 3 trials, never recalled haw to stand.  Ambulation/Gait Ambulation/Gait assistance: Mod assist;Max assist;+2 physical assistance;+2 safety/equipment Ambulation Distance (Feet): 25 Feet Assistive device: Rolling walker (2 wheeled) Gait Pattern/deviations: Step-to pattern;Step-through pattern;Leaning posteriorly;Staggering right;Staggering left;Trunk flexed;Narrow base of support;Decreased weight shift to right;Decreased weight shift to left     General Gait Details: ambulated x 125' second trial, very unsteady, multimodal cues for safety and for  use of RW, hand placement and posture.  Stairs            Wheelchair Mobility     Modified Rankin (Stroke Patients Only)       Balance Overall balance assessment: Needs assistance Sitting-balance support: Feet supported;Bilateral upper extremity supported Sitting balance-Leahy Scale: Poor   Postural control: Posterior lean Standing balance support: During functional activity;Bilateral upper extremity supported Standing balance-Leahy Scale: Poor                               Pertinent Vitals/Pain Pain Assessment: No/denies pain    Home Living Family/patient expects to be discharged to:: Private residence Living Arrangements: Spouse/significant other Available Help at Discharge: Family;Available PRN/intermittently Type of Home: House Home Access: Stairs to enter Entrance Stairs-Rails: Left Entrance Stairs-Number of Steps: 2+1 Home Layout: Two level;Able to live on main level with bedroom/bathroom Home Equipment: None Additional Comments: per sig. other, patient home alone at nights    Prior Function Level of Independence: Independent               Hand Dominance        Extremity/Trunk Assessment   Upper Extremity Assessment: Generalized weakness;RUE deficits/detail;LUE deficits/detail RUE Deficits / Details: jerking, decreased grips on RW, drops hands at times     LUE Deficits / Details: similar to R   Lower Extremity Assessment: Generalized weakness;RLE deficits/detail;LLE deficits/detail RLE Deficits / Details: decreased  support standing, knees flexed. LLE Deficits / Details: similar to R     Communication   Communication: Expressive difficulties  Cognition Arousal/Alertness: Awake/alert Behavior During Therapy: Flat affect Overall Cognitive Status: Impaired/Different from baseline Area of Impairment: Orientation;Attention;Memory;Following commands;Safety/judgement;Awareness;Problem solving Orientation Level: Place;Time;Situation Current Attention Level: Alternating Memory: Decreased recall of precautions;Decreased  short-term memory Following Commands: Follows  one step commands inconsistently Safety/Judgement: Decreased awareness of safety;Decreased awareness of deficits   Problem Solving: Slow processing;Decreased initiation;Requires verbal cues;Difficulty sequencing      General Comments      Exercises General Exercises - Lower Extremity Long Arc Quad: AROM;Both;5 reps;Seated      Assessment/Plan    PT Assessment Patient needs continued PT services  PT Diagnosis Difficulty walking;Abnormality of gait;Generalized weakness;Altered mental status   PT Problem List Decreased strength;Decreased range of motion;Decreased activity tolerance;Decreased balance;Decreased mobility;Decreased knowledge of precautions;Decreased safety awareness;Decreased knowledge of use of DME;Cardiopulmonary status limiting activity  PT Treatment Interventions DME instruction;Gait training;Functional mobility training;Stair training;Therapeutic activities;Therapeutic exercise;Patient/family education   PT Goals (Current goals can be found in the Care Plan section) Acute Rehab PT Goals Patient Stated Goal: per girlfriend, to return honme PT Goal Formulation: With patient/family Time For Goal Achievement: 01/15/14 Potential to Achieve Goals: Good    Frequency Min 3X/week   Barriers to discharge Decreased caregiver support      Co-evaluation               End of Session Equipment Utilized During Treatment: Gait belt Activity Tolerance: Patient tolerated treatment well Patient left: in chair;with call bell/phone within reach;with chair alarm set;with family/visitor present Nurse Communication: Mobility status         Time: 0810-0840 PT Time Calculation (min) (ACUTE ONLY): 30 min   Charges:   PT Evaluation $Initial PT Evaluation Tier I: 1 Procedure PT Treatments $Gait Training: 23-37 mins   PT G Codes:          Rada HayHill, Lavaughn Haberle Elizabeth 01/01/2014, 10:11 AM Blanchard KelchKaren Lucciana Head PT [phone]

## 2014-01-01 NOTE — Plan of Care (Signed)
Problem: Phase II Progression Outcomes Goal: Progress activity as tolerated unless otherwise ordered Outcome: Progressing     

## 2014-01-02 NOTE — Plan of Care (Signed)
Problem: Phase II Progression Outcomes Goal: Progress activity as tolerated unless otherwise ordered Outcome: Not Progressing Unsteady gait. Up with assistance, weak and confused

## 2014-01-02 NOTE — Progress Notes (Signed)
Patient ID: Derrick Cook, male   DOB: 10/24/1955, 58 y.o.   MRN: [id number] [facility] PROGRESS NOTE  Derrick Cook [id number] DOB: 04/20/1955 DOA: 12/28/2013 PCP: No primary care provider on file.  Brief narrative:    58 y.o. male with a PMH of severe ETOH abuse, history of alcohol withdrawal seizures with a seizure noted 12/28/13, who was admitted 12/28/13 secondary to the development of DTs with prominent tremulousness, confusion and disorientation.    Assessment/Plan:     Principal Problem:  DTs (delirium tremens) in the setting of a history of alcohol abuse with alcohol withdrawal seizures / Acute encephalopathy  - pt required precedex drip to be started 12/30/2013 due to more restlessness, agitation. Now much better and precedex weaned off 11/282015 am. - will continue ativan 2 mg IV every 4 hours as needed for agitation, withdrawals - no reports of withdrawals so far  - CT head on admission showed no acute intracranial findings  - home health orders in place  Active Problems:  Cerebrovascular disease with history of lacunar strokes and brain atrophy - Continue aspirin and Aricept.   Hypokalemia / hypomagnesemia - Potassium repleted through IV fluids. Potassium WNL. Magnesium level WNL   Hyperglycemia - Hemoglobin A1c 5.7%, indicating the patient is at increased risk of developing diabetes. - counsel on diet    Hypertension  - secondary to withdrawals - has clonidine patch  - added hydralazine to the regimen 10 mg IV every 6 hours and PRN if BP still above 150/90   DVT prophylaxis - Subcutaneous heparin ordered.  Code Status: Full. Family Communication: Significant other updated at the bedside. Disposition Plan: home in next 24 hours    IV access:   Peripheral IV  Procedures and diagnostic studies:    Ct Head Wo Contrast 12/28/2013 1. No acute intracranial abnormality. 2. Moderate to severe generalized atrophy and severe chronic  microvascular ischemic changes of the white matter. 3. 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 4. Severe chronic left maxillary sinusitis. If the patient has not had a prior medial antrostomy, there is erosion of the anterior inferior medial wall of the sinus. Possible mucocele in the left nasal cavity Medical Consultants:   None   Other Consultants:   None  IAnti-Infectives:    None    Manson PasseyEVINE, Javon Snee, MD  [facility] Pager [phone]  If 7PM-7AM, please contact night-coverage [email or web address] Password [email or web address] 01/02/2014, 12:47 PM   LOS: 5 days    HPI/Subjective: No acute overnight events.  Objective: Filed Vitals:   01/01/14 1041 01/01/14 2215 01/02/14 0143 01/02/14 0445  BP: 151/91 141/89 149/90 146/101  Pulse: 98 120 119 114  Temp: 97.8 F (36.6 C) 99.6 F (37.6 C)  98 F (36.7 C)  TempSrc: Oral Oral  Axillary  Resp: 18 20  20   Height:      Weight:      SpO2: 99% 95%  99%    Intake/Output Summary (Last 24 hours) at 01/02/14 1247 Last data filed at 01/02/14 0900  Gross per 24 hour  Intake   1080 ml  Output    500 ml  Net    580 ml    Exam:   General:  Pt is alert, follows commands appropriately, not in acute distress  Cardiovascular: Regular rate and rhythm, S1/S2, no murmurs  Respiratory: Clear to auscultation bilaterally, no wheezing, no crackles, no rhonchi  Extremities: No edema, pulses DP and PT palpable bilaterally  Neuro: able to ambulate with assistance  very short distances; LE weakness  Data Reviewed: Basic Metabolic Panel:  Recent Labs Lab 12/28/13 0832 12/28/13 0834 12/28/13 2116 12/29/13 0810 12/30/13 0355 12/31/13 0350  NA 138 137  --  138 136* 140  K 3.6* 3.2*  --  3.3* 3.6* 4.1  CL 95* 96  --  98 99 104  CO2 18* 22  --  21 20 20   GLUCOSE 136* 147*  --  93 109* 112*  BUN 6 5*  --  8 6 8   CREATININE 0.84 0.83 0.80 0.83 0.84 0.82  CALCIUM 10.1 9.8  --  9.2 9.4 9.7  MG 1.4*  --   --   --  1.7 1.8    Liver Function Tests:  Recent Labs Lab 12/29/13 0810  AST 59*  ALT 45  ALKPHOS 92  BILITOT 1.0  PROT 7.3  ALBUMIN 3.5   No results for input(s): LIPASE, AMYLASE in the last 168 hours. No results for input(s): AMMONIA in the last 168 hours. CBC:  Recent Labs Lab 12/28/13 0834 12/31/13 0350  WBC 6.5 10.0  NEUTROABS 4.1  --   HGB 15.4 14.6  HCT 44.7 44.9  MCV 95.1 98.0  PLT 199 216   Cardiac Enzymes: No results for input(s): CKTOTAL, CKMB, CKMBINDEX, TROPONINI in the last 168 hours. BNP: Invalid input(s): POCBNP CBG: No results for input(s): GLUCAP in the last 168 hours.  Recent Results (from the past 240 hour(s))  MRSA PCR Screening     Status: None   Collection Time: 12/28/13  8:50 PM  Result Value Ref Range Status   MRSA by PCR NEGATIVE NEGATIVE Final    Comment:        The GeneXpert MRSA Assay (FDA approved for NASAL specimens only), is one component of a comprehensive MRSA colonization surveillance program. It is not intended to diagnose MRSA infection nor to guide or monitor treatment for MRSA infections.      Scheduled Meds: . antiseptic oral rinse  7 mL Mouth Rinse q12n4p  . aspirin EC  81 mg Oral QPM  . chlorhexidine  15 mL Mouth Rinse BID  . cloNIDine  0.2 mg Transdermal Weekly  . docusate sodium  100 mg Oral BID  . donepezil  10 mg Oral QHS  . folic acid  1 mg Oral Daily  . heparin  5,000 Units Subcutaneous 3 times per day  . hydrALAZINE  10 mg Intravenous Q6H  . multivitamin with minerals  1 tablet Oral Daily  . nicotine  14 mg Transdermal Daily  . sodium chloride  3 mL Intravenous Q12H  . thiamine  100 mg Oral Daily   Continuous Infusions: . sodium chloride 10 mL/hr at 01/02/14 [phone]

## 2014-01-03 DIAGNOSIS — F10230 Alcohol dependence with withdrawal, uncomplicated: Secondary | ICD-10-CM

## 2014-01-03 DIAGNOSIS — F10939 Alcohol use, unspecified with withdrawal, unspecified: Secondary | ICD-10-CM | POA: Diagnosis present

## 2014-01-03 DIAGNOSIS — F10129 Alcohol abuse with intoxication, unspecified: Secondary | ICD-10-CM

## 2014-01-03 DIAGNOSIS — F10239 Alcohol dependence with withdrawal, unspecified: Secondary | ICD-10-CM | POA: Diagnosis present

## 2014-01-03 MED ORDER — AMLODIPINE BESYLATE 5 MG PO TABS: 5.0000 mg | ORAL_TABLET | Freq: Every day | ORAL | Status: DC

## 2014-01-03 MED ORDER — THIAMINE HCL 100 MG PO TABS: 100.0000 mg | ORAL_TABLET | Freq: Every day | ORAL | Status: DC

## 2014-01-03 MED ORDER — ADULT MULTIVITAMIN W/MINERALS CH: 1.0000 | ORAL_TABLET | Freq: Every day | ORAL | Status: AC

## 2014-01-03 MED ORDER — FOLIC ACID 1 MG PO TABS: 1.0000 mg | ORAL_TABLET | Freq: Every day | ORAL | Status: DC

## 2014-01-03 NOTE — Progress Notes (Signed)
Physical Therapy Treatment Patient Details Name: Derrick Cook MRN: [id number] DOB: 10/24/1955 Today's Date: 01/03/2014    History of Present Illness Derrick Cook is an 58 y.o. male with a PMH of ETOH abuse, history of alcohol withdrawal seizures with a seizure noted AM of admission on 12/28/13.Admitted secondary  to the development of DTs with prominent tremulousness    PT Comments    Patient presents with significant balance impairments for  Static and dynamic. Several  Episodes of balance loss and required external support for fall prevention. Per  Family , patient will be home alone at night. PT recommends 24/7 caregivers. Spoke to patient about post acute rehab and he declines. His significant other other did not comment on this issue. Patient reports"I can't stay here. I am going home." Patient also commented" this is scary" when  Working on balance  And ambulation without UE support. Patient presents as high fall risk based on  Observations during this session. If DC's home recommend HHPT. Recommend  OT  While in acute care until DC'd.  Follow Up Recommendations  SNF;Supervision/Assistance - 24 hour (stressed need with patient and  family.)     Equipment Recommendations  Rolling walker with 5" wheels    Recommendations for Other Services OT consult     Precautions / Restrictions Precautions Precautions: Fall    Mobility  Bed Mobility Overal bed mobility: Needs Assistance Bed Mobility: Supine to Sit     Supine to sit: Supervision     General bed mobility comments: extra time to initiate, tends to  use momentum to come to sitting.  Transfers Overall transfer level: Needs assistance Equipment used: Rolling walker (2 wheeled) Transfers: Sit to/from Stand Sit to Stand: Min guard;Min assist         General transfer comment: made 5 attempts to stand from bed before  getting up, would sit back down each time until able to stand, did hole onto RW, No external support ofered  in order  to see how patient does.  Pt got from recliner with armrests with less effort, did  have decreased control of descent  to recliner x 1 as patient had begun to lose his balance after turning around to prepare to sit down.  Ambulation/Gait Ambulation/Gait assistance: Min assist;Mod assist Ambulation Distance (Feet): 400 Feet Assistive device: Rolling walker (2 wheeled) Gait Pattern/deviations: Step-through pattern;Narrow base of support Gait velocity: decreased Gait velocity interpretation: <1.8 ft/sec, indicative of risk for recurrent falls General Gait Details: patient's knees arwe flexed throughout ambulation, allowed ambulation x 100' x 2 without RW. Gait much slower, retropulsive, staggers , external support x 2 for balance losses., when challenged with stop/starts and turning to look, noted loss of balance.   Stairs            Wheelchair Mobility    Modified Rankin (Stroke Patients Only)       Balance Overall balance assessment: Needs assistance Sitting-balance support: Feet supported Sitting balance-Leahy Scale: Fair Sitting balance - Comments: patient donned shoes while sitting on edge of bed with much increase in time to complete task, noted decreased fine motor to tie shoes.   Standing balance support: During functional activity;No upper extremity supported Standing balance-Leahy Scale: Poor Standing balance comment: trunk is  in posterior COG, knees flexed, loss of balance with challenges to shift and look around., had patient move objects from cabinet, dropped  items, knocked items off of counter, very clumsy UE and decreased  fine motor. picked up object from floor and  lost balance posteriorly upon standing upright.                    Cognition Arousal/Alertness: Awake/alert Behavior During Therapy: Flat affect Overall Cognitive Status: Impaired/Different from baseline Area of Impairment: Memory;Safety/judgement;Awareness   Current Attention  Level: Selective Memory: Decreased recall of precautions Following Commands: Follows one step commands consistently Safety/Judgement: Decreased awareness of safety;Decreased awareness of deficits   Problem Solving: Slow processing General Comments: pt states " This is scarey" when working on mobility without UE support, Pt is verbally declining need for post acute rehab,  sig. other in room and does not  add any info related to   patient's current status, decreased sag=fety. She reports that patient will be home alone at nioght. Reiterated patients high fall risk.    Exercises      General Comments        Pertinent Vitals/Pain Pain Assessment: No/denies pain    Home Living                      Prior Function            PT Goals (current goals can now be found in the care plan section) Progress towards PT goals: Progressing toward goals    Frequency  Min 3X/week    PT Plan Current plan remains appropriate    Co-evaluation             End of Session Equipment Utilized During Treatment: Gait belt Activity Tolerance: Patient tolerated treatment well Patient left: in chair;with call bell/phone within reach;with chair alarm set;with family/visitor present     Time: 0832-0905 PT Time Calculation (min) (ACUTE ONLY): 33 min  Charges:  $Gait Training: 23-37 mins                    G Codes:      Rada HayHill, Kristee Angus Elizabeth 01/03/2014, 9:44 AM Blanchard KelchKaren Tashon Capp PT [phone]

## 2014-01-03 NOTE — Discharge Instructions (Signed)
Alcohol Intoxication  Alcohol intoxication occurs when the amount of alcohol that a person has consumed impairs his or her ability to mentally and physically function. Alcohol directly impairs the normal chemical activity of the brain. Drinking large amounts of alcohol can lead to changes in mental function and behavior, and it can cause many physical effects that can be harmful.   Alcohol intoxication can range in severity from mild to very severe. Various factors can affect the level of intoxication that occurs, such as the person's age, gender, weight, frequency of alcohol consumption, and the presence of other medical conditions (such as diabetes, seizures, or heart conditions). Dangerous levels of alcohol intoxication may occur when people drink large amounts of alcohol in a short period (binge drinking). Alcohol can also be especially dangerous when combined with certain prescription medicines or "recreational" drugs.  SIGNS AND SYMPTOMS  Some common signs and symptoms of mild alcohol intoxication include:  · Loss of coordination.  · Changes in mood and behavior.  · Impaired judgment.  · Slurred speech.  As alcohol intoxication progresses to more severe levels, other signs and symptoms will appear. These may include:  · Vomiting.  · Confusion and impaired memory.  · Slowed breathing.  · Seizures.  · Loss of consciousness.  DIAGNOSIS   Your health care provider will take a medical history and perform a physical exam. You will be asked about the amount and type of alcohol you have consumed. Blood tests will be done to measure the concentration of alcohol in your blood. In many places, your blood alcohol level must be lower than 80 mg/dL (0.08%) to legally drive. However, many dangerous effects of alcohol can occur at much lower levels.   TREATMENT   People with alcohol intoxication often do not require treatment. Most of the effects of alcohol intoxication are temporary, and they go away as the alcohol naturally  leaves the body. Your health care provider will monitor your condition until you are stable enough to go home. Fluids are sometimes given through an IV access tube to help prevent dehydration.   HOME CARE INSTRUCTIONS  · Do not drive after drinking alcohol.  · Stay hydrated. Drink enough water and fluids to keep your urine clear or pale yellow. Avoid caffeine.    · Only take over-the-counter or prescription medicines as directed by your health care provider.    SEEK MEDICAL CARE IF:   · You have persistent vomiting.    · You do not feel better after a few days.  · You have frequent alcohol intoxication. Your health care provider can help determine if you should see a substance use treatment counselor.  SEEK IMMEDIATE MEDICAL CARE IF:   · You become shaky or tremble when you try to stop drinking.    · You shake uncontrollably (seizure).    · You throw up (vomit) blood. This may be bright red or may look like black coffee grounds.    · You have blood in your stool. This may be bright red or may appear as a black, tarry, bad smelling stool.    · You become lightheaded or faint.    MAKE SURE YOU:   · Understand these instructions.  · Will watch your condition.  · Will get help right away if you are not doing well or get worse.  Document Released: 10/31/2004 Document Revised: 09/23/2012 Document Reviewed: 06/26/2012  [employer]® Patient Information ©2015 [employer]. This information is not intended to replace advice given to you by your health care provider. Make sure   you discuss any questions you have with your health care provider.

## 2014-01-03 NOTE — Progress Notes (Signed)
Pt and friend at bedside selected [facility] for Home Health needs. Referral given to in house rep. [facility] at 9:00 AM.

## 2014-01-03 NOTE — Progress Notes (Signed)
Patient discharged home with friend. Discharge instructions given and explained to patient/friend and they verbalized understanding. Denies any distress. Accompanied home by friend. No wound noted, skin intact.

## 2014-01-03 NOTE — Discharge Summary (Signed)
Physician Discharge Summary  Derrick Cook ZOX:[id number] DOB: 03/11/1955 DOA: 12/28/2013  PCP: No primary care provider on file.  Admit date: 12/28/2013 Discharge date: 01/03/2014  Recommendations for Outpatient Follow-up:  1. Patient instructed to take Norvasc 5 mg daily for blood pressure control 2. Patient offered the opportunity to talk to social workers about alcohol abuse but he said he will take initiative on his own and seek outpatient programs. 3. Patient instructed to take multivitamin, thiamine and folic acid and to abstain from alcohol 4. Home health orders in place  Discharge Diagnoses:  Principal Problem:   DTs (delirium tremens) Active Problems:   S/P alcohol detoxification   Alcohol abuse   Alcohol withdrawal seizure   Hypokalemia   Hyperglycemia   High blood pressure   Delirium tremens   Hypomagnesemia    Discharge Condition: stable   Diet recommendation: as tolerated   History of present illness:  58 y.o. male with a PMH of severe ETOH abuse, history of alcohol withdrawal seizures with a seizure noted 12/28/13, who was admitted 12/28/13 secondary to the development of DTs with prominent tremulousness, confusion and disorientation.  Assessment/Plan:   Principal Problem:  DTs (delirium tremens) in the setting of a history of alcohol abuse with alcohol withdrawal seizures / Acute encephalopathy  - pt required precedex drip to be started 12/30/2013 due to more restlessness, agitation. Now much better and precedex weaned off 11/282015 am. - no reports of withdrawals  - no reports of seizures  - CT head on admission showed no acute intracranial findings  - home health orders in place  Active Problems:  Cerebrovascular disease with history of lacunar strokes and brain atrophy - Continue aspirin and Aricept.   Hypokalemia / hypomagnesemia - Potassium repleted through IV fluids. Potassium WNL. Magnesium level WNL   Hyperglycemia - Hemoglobin A1c  5.7%, indicating the patient is at increased risk of developing diabetes. - counseled on diet    Hypertension  - secondary to withdrawals - has had clonidine patch  - added hydralazine to the regimen 10 mg IV every 6 hours and PRN if BP still above 150/90 - BP controlled; will discharge pt on Norvasc once daily regimen which   DVT prophylaxis - Subcutaneous heparin ordered while pt is in hospital   Code Status: Full. Family Communication: Significant other updated at the bedside.    IV access:   Peripheral IV  Procedures and diagnostic studies:   Ct Head Wo Contrast 12/28/2013 1. No acute intracranial abnormality. 2. Moderate to severe generalized atrophy and severe chronic microvascular ischemic changes of the white matter. 3. 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 4. Severe chronic left maxillary sinusitis. If the patient has not had a prior medial antrostomy, there is erosion of the anterior inferior medial wall of the sinus. Possible mucocele in the left nasal cavity Medical Consultants:   None  Other Consultants:   None  IAnti-Infectives:    None  Signed:  Manson Cook, Derrick Hornback, MD  [facility] 01/03/2014, 10:07 AM  Pager #: [phone]    Discharge Exam: Filed Vitals:   01/03/14 0601  BP: 139/86  Pulse: 106  Temp: 99.3 F (37.4 C)  Resp: 20   Filed Vitals:   01/02/14 2111 01/02/14 2157 01/02/14 2300 01/03/14 0601  BP: 132/69 155/92 143/81 139/86  Pulse: 116 104 103 106  Temp: 99 F (37.2 C) 99.6 F (37.6 C)  99.3 F (37.4 C)  TempSrc: Oral Oral  Oral  Resp: 20 20  20  Height:      Weight:      SpO2: 98% 95%  96%    General: Pt is alert, follows commands appropriately, not in acute distress Cardiovascular: Regular rate and rhythm, S1/S2 +, no murmurs Respiratory: Clear to auscultation bilaterally, no wheezing, no crackles, no rhonchi Abdominal: Soft, non tender, non distended, bowel sounds +, no  guarding Extremities: no edema, no cyanosis, pulses palpable bilaterally DP and PT Neuro: Grossly nonfocal  Discharge Instructions  Discharge Instructions    Call MD for:  difficulty breathing, headache or visual disturbances    Complete by:  As directed      Call MD for:  persistant dizziness or light-headedness    Complete by:  As directed      Call MD for:  persistant nausea and vomiting    Complete by:  As directed      Call MD for:  severe uncontrolled pain    Complete by:  As directed      Diet - low sodium heart healthy    Complete by:  As directed      Discharge instructions    Complete by:  As directed   1. Take Norvasc 5 mg daily for blood pressure control     Increase activity slowly    Complete by:  As directed             Medication List    TAKE these medications        amLODipine 5 MG tablet  Commonly known as:  NORVASC  Take 1 tablet (5 mg total) by mouth daily.     aspirin 81 MG tablet  Take 81 mg by mouth every evening.     donepezil 10 MG tablet  Commonly known as:  ARICEPT  Take 10 mg by mouth at bedtime.     folic acid 1 MG tablet  Commonly known as:  FOLVITE  Take 1 tablet (1 mg total) by mouth daily.     ibuprofen 200 MG tablet  Commonly known as:  ADVIL,MOTRIN  Take 1,000 mg by mouth every 6 (six) hours as needed for mild pain or moderate pain.     multivitamin with minerals Tabs tablet  Take 1 tablet by mouth daily.     thiamine 100 MG tablet  Take 1 tablet (100 mg total) by mouth daily.           Follow-up Information    Follow up with [facility]    . Schedule an appointment as soon as possible for a visit in 2 weeks.   Why:  As needed, Follow up appt after recent hospitalization   Contact information:   [address] [place] [phone]       The results of significant diagnostics from this hospitalization (including imaging, microbiology, ancillary and  laboratory) are listed below for reference.    Significant Diagnostic Studies: Ct Head Wo Contrast  12/28/2013   CLINICAL DATA:  By report, patient trying to withdraw from recent excessive alcohol use and sustained a seizure earlier today associated with headache. Prior history of alcohol withdrawal seizures.  EXAM: CT HEAD WITHOUT CONTRAST  TECHNIQUE: Contiguous axial images were obtained from the base of the skull through the vertex without intravenous contrast.  COMPARISON:  None.  FINDINGS: Moderate cortical and deep atrophy and moderate to severe cerebellar atrophy for age. Severe changes of small vessel disease of the white matter diffusely with old lacunar  strokes in the left basal ganglia and right thalamus. No mass lesion. No midline shift. No acute hemorrhage or hematoma. No extra-axial fluid collections. No evidence of acute infarction.  No skull fracture or other focal osseous abnormality involving the skull. Absence of the anterior inferior medial wall of the left maxillary sinus, with soft tissue protruding into the left nasal cavity, with severe mucosal thickening in the left maxillary sinus and thickening of the sinus walls, indicating chronicity. Apparent prior bilateral turbinectomies. Remaining visualized paranasal sinuses, bilateral mastoid air cells and bilateral middle ear cavities well aerated. Mild bilateral carotid siphon atherosclerosis.  IMPRESSION: 1. No acute intracranial abnormality. 2. Moderate to severe generalized atrophy and severe chronic microvascular ischemic changes of the white matter. 3. 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 4. Severe chronic left maxillary sinusitis. If the patient has not had a prior medial antrostomy, there is erosion of the anterior inferior medial wall of the sinus. Possible mucocele in the left nasal cavity.   Electronically Signed   By: Hulan Saas M.D.   On: 12/28/2013 10:24    Microbiology: Recent Results  (from the past 240 hour(s))  MRSA PCR Screening     Status: None   Collection Time: 12/28/13  8:50 PM  Result Value Ref Range Status   MRSA by PCR NEGATIVE NEGATIVE Final    Comment:        The GeneXpert MRSA Assay (FDA approved for NASAL specimens only), is one component of a comprehensive MRSA colonization surveillance program. It is not intended to diagnose MRSA infection nor to guide or monitor treatment for MRSA infections.      Labs: Basic Metabolic Panel:  Recent Labs Lab 12/28/13 0832 12/28/13 0834 12/28/13 2116 12/29/13 0810 12/30/13 0355 12/31/13 0350  NA 138 137  --  138 136* 140  K 3.6* 3.2*  --  3.3* 3.6* 4.1  CL 95* 96  --  98 99 104  CO2 18* 22  --  21 20 20   GLUCOSE 136* 147*  --  93 109* 112*  BUN 6 5*  --  8 6 8   CREATININE 0.84 0.83 0.80 0.83 0.84 0.82  CALCIUM 10.1 9.8  --  9.2 9.4 9.7  MG 1.4*  --   --   --  1.7 1.8   Liver Function Tests:  Recent Labs Lab 12/29/13 0810  AST 59*  ALT 45  ALKPHOS 92  BILITOT 1.0  PROT 7.3  ALBUMIN 3.5   No results for input(s): LIPASE, AMYLASE in the last 168 hours. No results for input(s): AMMONIA in the last 168 hours. CBC:  Recent Labs Lab 12/28/13 0834 12/31/13 0350  WBC 6.5 10.0  NEUTROABS 4.1  --   HGB 15.4 14.6  HCT 44.7 44.9  MCV 95.1 98.0  PLT 199 216   Cardiac Enzymes: No results for input(s): CKTOTAL, CKMB, CKMBINDEX, TROPONINI in the last 168 hours. BNP: BNP (last 3 results) No results for input(s): PROBNP in the last 8760 hours. CBG: No results for input(s): GLUCAP in the last 168 hours.  Time coordinating discharge: Over 30 minutes

## 2014-01-04 ENCOUNTER — Encounter: Payer: Self-pay | Admitting: Internal Medicine

## 2014-01-04 ENCOUNTER — Ambulatory Visit: Payer: Medicaid Other | Attending: Internal Medicine | Admitting: Internal Medicine

## 2014-01-04 VITALS — BP 116/79 | HR 113 | Temp 97.7°F | Resp 18 | Ht 75.0 in | Wt 184.0 lb

## 2014-01-04 DIAGNOSIS — F10239 Alcohol dependence with withdrawal, unspecified: Secondary | ICD-10-CM

## 2014-01-04 MED ORDER — IBUPROFEN 800 MG PO TABS: 800.0000 mg | ORAL_TABLET | Freq: Three times a day (TID) | ORAL | Status: DC | PRN

## 2014-01-04 MED ORDER — CHLORDIAZEPOXIDE HCL 25 MG PO CAPS: 25.0000 mg | ORAL_CAPSULE | Freq: Three times a day (TID) | ORAL | Status: DC | PRN

## 2014-01-04 NOTE — Progress Notes (Signed)
Establish Care HFU due to Sz Stated need Rx Vallum for migrane and Ibuprofen 800mg 

## 2014-01-04 NOTE — Progress Notes (Signed)
Patient ID: Derrick PebblesKevin Ulin, male   DOB: 10/17/1955, 58 y.o.   MRN: [id number]   Derrick PebblesKevin Mesler, is a 58 y.o. male  [id number]  [id number]  DOB - 11/07/1955  CC:  Chief Complaint  Patient presents with  . Establish Care  . Hospitalization Follow-up       HPI: Derrick Cook is a 58 y.o. male here today to establish medical care. Patient has history of ethanol abuse with alcohol withdrawal seizures in the past was recently admitted in the hospital for alcohol withdrawal seizure and subsequent development of DTs evidenced by tremulousness, confusion, and disorientation. Patient was managed and discharged, he is here today for hospital follow-up. He claims he needs Valium because that will help him with his alcohol withdrawal. He has no complaint today. He told me he has no intention to quit alcohol for detox, were counseled about joining the alcohol anonymous, patient claimed he would never be part or any form of rehabilitation whatsoever. He has not had any seizure since discharge from the hospital. Patient's last drink was yesterday. Patient has not had colonoscopy, but he declined one today. Patient has No headache, No chest pain, No abdominal pain - No Nausea, No new weakness tingling or numbness, No Cough - SOB.  No Known Allergies Past Medical History  Diagnosis Date  . Memory problem   . Alcohol abuse   . Alcohol withdrawal seizure   . 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 Chronic left maxillary sinusitis   . Stroke   . Hypertension    Current Outpatient Prescriptions on File Prior to Visit  Medication Sig Dispense Refill  . amLODipine (NORVASC) 5 MG tablet Take 1 tablet (5 mg total) by mouth daily. 30 tablet 0  . aspirin 81 MG tablet Take 81 mg by mouth every evening.    . donepezil (ARICEPT) 10 MG tablet Take 10 mg by mouth at bedtime.    . folic acid (FOLVITE) 1 MG tablet Take 1 tablet (1 mg total) by mouth daily. 30 tablet  0  . Multiple Vitamin (MULTIVITAMIN WITH MINERALS) TABS tablet Take 1 tablet by mouth daily. 30 tablet 0  . thiamine 100 MG tablet Take 1 tablet (100 mg total) by mouth daily. 30 tablet 0   No current facility-administered medications on file prior to visit.   History reviewed. No pertinent family history. History   Social History  . Marital Status: Significant Other    Spouse Name: N/A    Number of Children: N/A  . Years of Education: N/A   Occupational History  . Not on file.   Social History Main Topics  . Smoking status: Current Every Day Smoker -- 0.50 packs/day    Types: Cigarettes  . Smokeless tobacco: Never Used  . Alcohol Use: 0.0 oz/week    0 Not specified per week     Comment: Heavy alcohol use  . Drug Use: No  . Sexual Activity: Not on file   Other Topics Concern  . Not on file   Social History Narrative   [place] recently [place] from [place] his fiance. He lives in the home with his fiance and her 2 children. Since moving to  to be with his fiance he has tried to stop drinking.     Review of Systems: Constitutional: Negative for fever, chills, diaphoresis, activity change, appetite change and fatigue. HENT: Negative for ear pain, nosebleeds, congestion, facial swelling, rhinorrhea, neck  pain, neck stiffness and ear discharge.  Eyes: Negative for pain, discharge, redness, itching and visual disturbance. Respiratory: Negative for cough, choking, chest tightness, shortness of breath, wheezing and stridor.  Cardiovascular: Negative for chest pain, palpitations and leg swelling. Gastrointestinal: Negative for abdominal distention. Genitourinary: Negative for dysuria, urgency, frequency, hematuria, flank pain, decreased urine volume, difficulty urinating and dyspareunia.  Musculoskeletal: Negative for back pain, joint swelling, arthralgia and gait problem. Neurological: Negative for dizziness, tremors, syncope, facial asymmetry, speech difficulty, weakness,  light-headedness, numbness and headaches.  Hematological: Negative for adenopathy. Does not bruise/bleed easily. Psychiatric/Behavioral: Negative for hallucinations, behavioral problems, confusion, dysphoric mood, decreased concentration and agitation.    Objective:   Filed Vitals:   01/04/14 0952  BP: 116/79  Pulse: 113  Temp: 97.7 F (36.5 C)  Resp: 18    Physical Exam: Constitutional: Patient appears well-developed and well-nourished. No distress. HENT: Normocephalic, atraumatic, External right and left ear normal. Oropharynx is clear and moist.  Eyes: Conjunctivae and EOM are normal. PERRLA, no scleral icterus. Neck: Normal ROM. Neck supple. No JVD. No tracheal deviation. No thyromegaly. CVS: RRR, S1/S2 +, no murmurs, no gallops, no carotid bruit.  Pulmonary: Effort and breath sounds normal, no stridor, rhonchi, wheezes, rales.  Abdominal: Soft. BS +, no distension, tenderness, rebound or guarding.  Musculoskeletal: Normal range of motion. No edema and no tenderness.  Lymphadenopathy: No lymphadenopathy noted, cervical, inguinal or axillary Neuro: Alert. Normal reflexes, muscle tone coordination. No cranial nerve deficit. Skin: Skin is warm and dry. No rash noted. Not diaphoretic. No erythema. No pallor. Psychiatric: Normal mood and affect. Behavior, judgment, thought content normal.  Lab Results  Component Value Date   WBC 10.0 12/31/2013   HGB 14.6 12/31/2013   HCT 44.9 12/31/2013   MCV 98.0 12/31/2013   PLT 216 12/31/2013   Lab Results  Component Value Date   CREATININE 0.82 12/31/2013   BUN 8 12/31/2013   NA 140 12/31/2013   K 4.1 12/31/2013   CL 104 12/31/2013   CO2 20 12/31/2013    Lab Results  Component Value Date   HGBA1C 5.7* 12/28/2013   Lipid Panel  No results found for: CHOL, TRIG, HDL, CHOLHDL, VLDL, LDLCALC     Assessment and plan:   1. Alcohol dependence with withdrawal with complication  - ibuprofen (ADVIL,MOTRIN) 800 MG tablet; Take 1  tablet (800 mg total) by mouth every 8 (eight) hours as needed.  Dispense: 60 tablet; Refill: 0 - chlordiazePOXIDE (LIBRIUM) 25 MG capsule; Take 1 capsule (25 mg total) by mouth 3 (three) times daily as needed for anxiety.  Dispense: 60 capsule; Refill: 0  Patient was counseled extensively about quitting alcohol, he was also counseled extensively about smoking cessation. Patient was very clear about his decision not to quit alcohol but may work on smoking cessation.  Return in about 3 months (around 04/05/2014), or if symptoms worsen or fail to improve, for Alcoholism, Alcohol Withdrawal.  The patient was given clear instructions to go to ER or return to medical center if symptoms don't improve, worsen or new problems develop. The patient verbalized understanding. The patient was told to call to get lab results if they haven't heard anything in the next week.     This note has been created with Education officer, environmentalDragon speech recognition software and smart phrase technology. Any transcriptional errors are unintentional.    Jeanann LewandowskyJEGEDE, Miyoko Hashimi, MD, MHA, FACP, FAAP [facility] [place], [place] [phone]   01/04/2014, 11:00 AM

## 2014-01-04 NOTE — Patient Instructions (Signed)
Alcohol Use Disorder Alcohol use disorder is a mental disorder. It is not a one-time incident of heavy drinking. Alcohol use disorder is the excessive and uncontrollable use of alcohol over time that leads to problems with functioning in one or more areas of daily living. People with this disorder risk harming themselves and others when they drink to excess. Alcohol use disorder also can cause other mental disorders, such as mood and anxiety disorders, and serious physical problems. People with alcohol use disorder often misuse other drugs.  Alcohol use disorder is common and widespread. Some people with this disorder drink alcohol to cope with or escape from negative life events. Others drink to relieve chronic pain or symptoms of mental illness. People with a family history of alcohol use disorder are at higher risk of losing control and using alcohol to excess.  SYMPTOMS  Signs and symptoms of alcohol use disorder may include the following:   Consumption ofalcohol inlarger amounts or over a longer period of time than intended.  Multiple unsuccessful attempts to cutdown or control alcohol use.   A great deal of time spent obtaining alcohol, using alcohol, or recovering from the effects of alcohol (hangover).  A strong desire or urge to use alcohol (cravings).   Continued use of alcohol despite problems at work, school, or home because of alcohol use.   Continued use of alcohol despite problems in relationships because of alcohol use.  Continued use of alcohol in situations when it is physically hazardous, such as driving a car.  Continued use of alcohol despite awareness of a physical or psychological problem that is likely related to alcohol use. Physical problems related to alcohol use can involve the brain, heart, liver, stomach, and intestines. Psychological problems related to alcohol use include intoxication, depression, anxiety, psychosis, delirium, and dementia.   The need for  increased amounts of alcohol to achieve the same desired effect, or a decreased effect from the consumption of the same amount of alcohol (tolerance).  Withdrawal symptoms upon reducing or stopping alcohol use, or alcohol use to reduce or avoid withdrawal symptoms. Withdrawal symptoms include:  Racing heart.  Hand tremor.  Difficulty sleeping.  Nausea.  Vomiting.  Hallucinations.  Restlessness.  Seizures. DIAGNOSIS Alcohol use disorder is diagnosed through an assessment by your health care provider. Your health care provider may start by asking three or four questions to screen for excessive or problematic alcohol use. To confirm a diagnosis of alcohol use disorder, at least two symptoms must be present within a 12-month period. The severity of alcohol use disorder depends on the number of symptoms:  Mild--two or three.  Moderate--four or five.  Severe--six or more. Your health care provider may perform a physical exam or use results from lab tests to see if you have physical problems resulting from alcohol use. Your health care provider may refer you to a mental health professional for evaluation. TREATMENT  Some people with alcohol use disorder are able to reduce their alcohol use to low-risk levels. Some people with alcohol use disorder need to quit drinking alcohol. When necessary, mental health professionals with specialized training in substance use treatment can help. Your health care provider can help you decide how severe your alcohol use disorder is and what type of treatment you need. The following forms of treatment are available:   Detoxification. Detoxification involves the use of prescription medicines to prevent alcohol withdrawal symptoms in the first week after quitting. This is important for people with a history of symptoms   of withdrawal and for heavy drinkers who are likely to have withdrawal symptoms. Alcohol withdrawal can be dangerous and, in severe cases, cause  death. Detoxification is usually provided in a hospital or in-patient substance use treatment facility.  Counseling or talk therapy. Talk therapy is provided by substance use treatment counselors. It addresses the reasons people use alcohol and ways to keep them from drinking again. The goals of talk therapy are to help people with alcohol use disorder find healthy activities and ways to cope with life stress, to identify and avoid triggers for alcohol use, and to handle cravings, which can cause relapse.  Medicines.Different medicines can help treat alcohol use disorder through the following actions:  Decrease alcohol cravings.  Decrease the positive reward response felt from alcohol use.  Produce an uncomfortable physical reaction when alcohol is used (aversion therapy).  Support groups. Support groups are run by people who have quit drinking. They provide emotional support, advice, and guidance. These forms of treatment are often combined. Some people with alcohol use disorder benefit from intensive combination treatment provided by specialized substance use treatment centers. Both inpatient and outpatient treatment programs are available. Document Released: 02/29/2004 Document Revised: 06/07/2013 Document Reviewed: 04/30/2012 ExitCare Patient Information 2015 [employer]. This information is not intended to replace advice given to you by your health care provider. Make sure you discuss any questions you have with your health care provider. Alcohol and Nutrition Nutrition serves two purposes. It provides energy. It also maintains body structure and function. Food supplies energy. It also provides the building blocks needed to replace worn or damaged cells. Alcoholics often eat poorly. This limits their supply of essential nutrients. This affects energy supply and structure maintenance. Alcohol also affects the body's nutrients in:  Digestion.  Storage.  Using and getting rid of waste  products. IMPAIRMENT OF NUTRIENT DIGESTION AND UTILIZATION   Once ingested, food must be broken down into small components (digested). Then it is available for energy. It helps maintain body structure and function. Digestion begins in the mouth. It continues in the stomach and intestines, with help from the pancreas. The nutrients from digested food are absorbed from the intestines into the blood. Then they are carried to the liver. The liver prepares nutrients for:  Immediate use.  Storage and future use.  Alcohol inhibits the breakdown of nutrients into usable molecules.  It decreases secretion of digestive enzymes from the pancreas.  Alcohol impairs nutrient absorption by damaging the cells lining the stomach and intestines.  It also interferes with moving some nutrients into the blood.  In addition, nutritional deficiencies themselves may lead to further absorption problems.  For example, folate deficiency changes the cells that line the small intestine. This impairs how water is absorbed. It also affects absorbed nutrients. These include glucose, sodium, and additional folate.  Even if nutrients are digested and absorbed, alcohol can prevent them from being fully used. It changes their transport, storage, and excretion. Impaired utilization of nutrients by alcoholics is indicated by:  Decreased liver stores of vitamins, such as vitamin A.  Increased excretion of nutrients such as fat. ALCOHOL AND ENERGY SUPPLY   Three basic nutritional components found in food are:  Carbohydrates.  Proteins.  Fats.  These are used as energy. Some alcoholics take in as much as 50% of their total daily calories from alcohol. They often neglect important foods.  Even when enough food is eaten, alcohol can impair the ways the body controls blood sugar (glucose) levels. It may   either increase or decrease blood sugar.  In non-diabetic alcoholics, increased blood sugar (hyperglycemia) is caused  by poor insulin secretion. It is usually temporary.  Decreased blood sugar (hypoglycemia) can cause serious injury even if this condition is short-lived. Low blood sugar can happen when a fasting or malnourished person drinks alcohol. When there is no food to supply energy, stored sugar is used up. The products of alcohol inhibit forming glucose from other compounds such as amino acids. As a result, alcohol causes the brain and other body tissue to lack glucose. It is needed for energy and function.  Alcohol is an energy source. But how the body processes and uses the energy from alcohol is complex. Also, when alcohol is substituted for carbohydrates, subjects tend to lose weight. This indicates that they get less energy from alcohol than from food. ALCOHOL - MAINTAINING CELL STRUCTURE AND FUNCTION  Structure Cells are made mostly of protein. So an adequate protein diet is important for maintaining cell structure. This is especially true if cells are being damaged. Research indicates that alcohol affects protein nutrition by causing impaired:  Digestion of proteins to amino acids.  Processing of amino acids by the small intestine and liver.  Synthesis of proteins from amino acids.  Protein secretion by the liver. Function Nutrients are essential for the body to function well. They provide the tools that the body needs to work well:   Proteins.  Vitamins.  Minerals. Alcohol can disrupt body function. It may cause nutrient deficiencies. And it may interfere with the way nutrients are processed. Vitamins  Vitamins are essential to maintain growth and normal metabolism. They regulate many of the body`s processes. Chronic heavy drinking causes deficiencies in many vitamins. This is caused by eating less. And, in some cases, vitamins may be poorly absorbed. For example, alcohol inhibits fat absorption. It impairs how the vitamins A, E, and D are normally absorbed along with dietary fats. Not  enough vitamin A may cause night blindness. Not enough vitamin D may cause softening of the bones.  Some alcoholics lack vitamins A, C, D, E, K, and the B vitamins. These are all involved in wound healing and cell maintenance. In particular, because vitamin K is necessary for blood clotting, lacking that vitamin can cause delayed clotting. The result is excess bleeding. Lacking other vitamins involved in brain function may cause severe neurological damage. Minerals Deficiencies of minerals such as calcium, magnesium, iron, and zinc are common in alcoholics. The alcohol itself does not seem to affect how these minerals are absorbed. Rather, they seem to occur secondary to other alcohol-related problems, such as:  Less calcium absorbed.  Not enough magnesium.  More urinary excretion.  Vomiting.  Diarrhea.  Not enough iron due to gastrointestinal bleeding.  Not enough zinc or losses related to other nutrient deficiencies.  Mineral deficiencies can cause a variety of medical consequences. These range from calcium-related bone disease to zinc-related night blindness and skin lesions. ALCOHOL, MALNUTRITION, AND MEDICAL COMPLICATIONS  Liver Disease   Alcoholic liver damage is caused primarily by alcohol itself. But poor nutrition may increase the risk of alcohol-related liver damage. For example, nutrients normally found in the liver are known to be affected by drinking alcohol. These include carotenoids, which are the major sources of vitamin A, and vitamin E compounds. Decreases in such nutrients may play some role in alcohol-related liver damage. Pancreatitis  Research suggests that malnutrition may increase the risk of developing alcoholic pancreatitis. Research suggests that a diet lacking in   protein may increase alcohol's damaging effect on the pancreas. Brain  Nutritional deficiencies may have severe effects on brain function. These may be permanent. Specifically, thiamine deficiencies  are often seen in alcoholics. They can cause severe neurological problems. These include:  Impaired movement.  Memory loss seen in Wernicke-Korsakoff syndrome. Pregnancy  Alcohol has toxic effects on fetal development. It causes alcohol-related birth defects. They include fetal alcohol syndrome. Alcohol itself is toxic to the fetus. Also, the nutritional deficiency can affect how the fetus develops. That may compound the risk of developmental damage.  Nutritional needs during pregnancy are 10% to 30% greater than normal. Food intake can increase by as much as 140% to cover the needs of both mother and fetus. An alcoholic mother`s nutritional problems may adversely affect the nutrition of the fetus. And alcohol itself can also restrict nutrition flow to the fetus. NUTRITIONAL STATUS OF ALCOHOLICS  Techniques for assessing nutritional status include:  Taking body measurements to estimate fat reserves. They include:  Weight.  Height.  Mass.  Skin fold thickness.  Performing blood analysis to provide measurements of circulating:  Proteins.  Vitamins.  Minerals.  These techniques tend to be imprecise. For many nutrients, there is no clear "cut-off" point that would allow an accurate definition of deficiency. So assessing the nutritional status of alcoholics is limited by these techniques. Dietary status may provide information about the risk of developing nutritional problems. Dietary status is assessed by:  Taking patients' dietary histories.  Evaluating the amount and types of food they are eating.  It is difficult to determine what exact amount of alcohol begins to have damaging effects on nutrition. In general, moderate drinkers have 2 drinks or less per day. They seem to be at little risk for nutritional problems. Various medical disorders begin to appear at greater levels.  Research indicates that the majority of even the heaviest drinkers have few obvious nutritional  deficiencies. Many alcoholics who are hospitalized for medical complications of their disease do have severe malnutrition. Alcoholics tend to eat poorly. Often they eat less than the amounts of food necessary to provide enough:  Carbohydrates.  Protein.  Fat.  Vitamins A and C.  B vitamins.  Minerals like calcium and iron. Of major concern is alcohol's effect on digesting food and use of nutrients. It may shift a mildly malnourished person toward severe malnutrition. Document Released: 11/15/2004 Document Revised: 04/15/2011 Document Reviewed: 05/01/2005 Exit[employer] Patient Information 2015 [employer]. This information is not intended to replace advice given to you by your health care provider. Make sure you discuss any questions you have with your health care provider.  

## 2014-01-21 ENCOUNTER — Other Ambulatory Visit: Payer: Self-pay | Admitting: Internal Medicine

## 2014-01-25 ENCOUNTER — Other Ambulatory Visit: Payer: Self-pay | Admitting: Emergency Medicine

## 2014-01-25 DIAGNOSIS — F10239 Alcohol dependence with withdrawal, unspecified: Secondary | ICD-10-CM

## 2014-01-25 MED ORDER — IBUPROFEN 800 MG PO TABS
800.0000 mg | ORAL_TABLET | Freq: Three times a day (TID) | ORAL | Status: DC | PRN
Start: 2014-01-25 — End: 2014-03-17

## 2014-01-28 ENCOUNTER — Other Ambulatory Visit: Payer: Self-pay | Admitting: Internal Medicine

## 2014-02-01 ENCOUNTER — Ambulatory Visit: Payer: Medicaid Other | Attending: Internal Medicine

## 2014-02-01 DIAGNOSIS — Z114 Encounter for screening for human immunodeficiency virus [HIV]: Secondary | ICD-10-CM

## 2014-02-02 LAB — HIV ANTIBODY (ROUTINE TESTING W REFLEX): HIV 1&2 Ab, 4th Generation: NONREACTIVE

## 2014-02-07 ENCOUNTER — Telehealth: Payer: Self-pay | Admitting: Internal Medicine

## 2014-02-07 NOTE — Telephone Encounter (Signed)
Pt is calling to inquire about her results. Please follow up with pt.  °

## 2014-02-07 NOTE — Telephone Encounter (Signed)
Patient is calling to inquire about his blood work results, please f/u with pt.

## 2014-02-08 NOTE — Telephone Encounter (Signed)
Pt was in our clinic, was given lab results

## 2014-02-17 ENCOUNTER — Encounter: Payer: Self-pay | Admitting: Internal Medicine

## 2014-02-17 ENCOUNTER — Ambulatory Visit: Payer: Medicaid Other | Attending: Internal Medicine | Admitting: Internal Medicine

## 2014-02-17 VITALS — BP 143/90 | HR 72 | Temp 98.5°F | Resp 18 | Ht 75.0 in | Wt 187.0 lb

## 2014-02-17 DIAGNOSIS — I10 Essential (primary) hypertension: Secondary | ICD-10-CM | POA: Insufficient documentation

## 2014-02-17 DIAGNOSIS — Z8673 Personal history of transient ischemic attack (TIA), and cerebral infarction without residual deficits: Secondary | ICD-10-CM | POA: Insufficient documentation

## 2014-02-17 DIAGNOSIS — N529 Male erectile dysfunction, unspecified: Secondary | ICD-10-CM | POA: Diagnosis not present

## 2014-02-17 DIAGNOSIS — R140 Abdominal distension (gaseous): Secondary | ICD-10-CM

## 2014-02-17 DIAGNOSIS — F10239 Alcohol dependence with withdrawal, unspecified: Secondary | ICD-10-CM | POA: Insufficient documentation

## 2014-02-17 DIAGNOSIS — R51 Headache: Secondary | ICD-10-CM | POA: Insufficient documentation

## 2014-02-17 DIAGNOSIS — Z9114 Patient's other noncompliance with medication regimen: Secondary | ICD-10-CM | POA: Insufficient documentation

## 2014-02-17 DIAGNOSIS — Z7982 Long term (current) use of aspirin: Secondary | ICD-10-CM | POA: Diagnosis not present

## 2014-02-17 DIAGNOSIS — G441 Vascular headache, not elsewhere classified: Secondary | ICD-10-CM | POA: Diagnosis not present

## 2014-02-17 DIAGNOSIS — N528 Other male erectile dysfunction: Secondary | ICD-10-CM | POA: Diagnosis not present

## 2014-02-17 DIAGNOSIS — F17210 Nicotine dependence, cigarettes, uncomplicated: Secondary | ICD-10-CM | POA: Insufficient documentation

## 2014-02-17 MED ORDER — AMLODIPINE BESYLATE 5 MG PO TABS: 5.0000 mg | ORAL_TABLET | Freq: Every day | ORAL | Status: DC

## 2014-02-17 MED ORDER — FOLIC ACID 1 MG PO TABS: 1.0000 mg | ORAL_TABLET | Freq: Every day | ORAL | Status: DC

## 2014-02-17 MED ORDER — CHLORDIAZEPOXIDE HCL 25 MG PO CAPS: 25.0000 mg | ORAL_CAPSULE | Freq: Three times a day (TID) | ORAL | Status: DC | PRN

## 2014-02-17 MED ORDER — DICLOFENAC SODIUM 75 MG PO TBEC: 75.0000 mg | DELAYED_RELEASE_TABLET | Freq: Two times a day (BID) | ORAL | Status: DC

## 2014-02-17 MED ORDER — THIAMINE HCL 100 MG PO TABS: 100.0000 mg | ORAL_TABLET | Freq: Every day | ORAL | Status: DC

## 2014-02-17 NOTE — Progress Notes (Signed)
Pt comes in with c/o frequent cluster headache x 2 weeks Denies dizziness,n,v or feeling faint Pt is no compliant with taking BP medication x 3 weeks BP- 143/90 72 Currently heavy smoker- offered smoking cessation Declined flu vaccine Continues to drink beer everyday

## 2014-02-17 NOTE — Patient Instructions (Signed)
Alcohol Use Disorder Alcohol use disorder is a mental disorder. It is not a one-time incident of heavy drinking. Alcohol use disorder is the excessive and uncontrollable use of alcohol over time that leads to problems with functioning in one or more areas of daily living. People with this disorder risk harming themselves and others when they drink to excess. Alcohol use disorder also can cause other mental disorders, such as mood and anxiety disorders, and serious physical problems. People with alcohol use disorder often misuse other drugs.  Alcohol use disorder is common and widespread. Some people with this disorder drink alcohol to cope with or escape from negative life events. Others drink to relieve chronic pain or symptoms of mental illness. People with a family history of alcohol use disorder are at higher risk of losing control and using alcohol to excess.  SYMPTOMS  Signs and symptoms of alcohol use disorder may include the following:   Consumption ofalcohol inlarger amounts or over a longer period of time than intended.  Multiple unsuccessful attempts to cutdown or control alcohol use.   A great deal of time spent obtaining alcohol, using alcohol, or recovering from the effects of alcohol (hangover).  A strong desire or urge to use alcohol (cravings).   Continued use of alcohol despite problems at work, school, or home because of alcohol use.   Continued use of alcohol despite problems in relationships because of alcohol use.  Continued use of alcohol in situations when it is physically hazardous, such as driving a car.  Continued use of alcohol despite awareness of a physical or psychological problem that is likely related to alcohol use. Physical problems related to alcohol use can involve the brain, heart, liver, stomach, and intestines. Psychological problems related to alcohol use include intoxication, depression, anxiety, psychosis, delirium, and dementia.   The need for  increased amounts of alcohol to achieve the same desired effect, or a decreased effect from the consumption of the same amount of alcohol (tolerance).  Withdrawal symptoms upon reducing or stopping alcohol use, or alcohol use to reduce or avoid withdrawal symptoms. Withdrawal symptoms include:  Racing heart.  Hand tremor.  Difficulty sleeping.  Nausea.  Vomiting.  Hallucinations.  Restlessness.  Seizures. DIAGNOSIS Alcohol use disorder is diagnosed through an assessment by your health care provider. Your health care provider may start by asking three or four questions to screen for excessive or problematic alcohol use. To confirm a diagnosis of alcohol use disorder, at least two symptoms must be present within a 12-month period. The severity of alcohol use disorder depends on the number of symptoms:  Mild--two or three.  Moderate--four or five.  Severe--six or more. Your health care provider may perform a physical exam or use results from lab tests to see if you have physical problems resulting from alcohol use. Your health care provider may refer you to a mental health professional for evaluation. TREATMENT  Some people with alcohol use disorder are able to reduce their alcohol use to low-risk levels. Some people with alcohol use disorder need to quit drinking alcohol. When necessary, mental health professionals with specialized training in substance use treatment can help. Your health care provider can help you decide how severe your alcohol use disorder is and what type of treatment you need. The following forms of treatment are available:   Detoxification. Detoxification involves the use of prescription medicines to prevent alcohol withdrawal symptoms in the first week after quitting. This is important for people with a history of symptoms   of withdrawal and for heavy drinkers who are likely to have withdrawal symptoms. Alcohol withdrawal can be dangerous and, in severe cases, cause  death. Detoxification is usually provided in a hospital or in-patient substance use treatment facility.  Counseling or talk therapy. Talk therapy is provided by substance use treatment counselors. It addresses the reasons people use alcohol and ways to keep them from drinking again. The goals of talk therapy are to help people with alcohol use disorder find healthy activities and ways to cope with life stress, to identify and avoid triggers for alcohol use, and to handle cravings, which can cause relapse.  Medicines.Different medicines can help treat alcohol use disorder through the following actions:  Decrease alcohol cravings.  Decrease the positive reward response felt from alcohol use.  Produce an uncomfortable physical reaction when alcohol is used (aversion therapy).  Support groups. Support groups are run by people who have quit drinking. They provide emotional support, advice, and guidance. These forms of treatment are often combined. Some people with alcohol use disorder benefit from intensive combination treatment provided by specialized substance use treatment centers. Both inpatient and outpatient treatment programs are available. Document Released: 02/29/2004 Document Revised: 06/07/2013 Document Reviewed: 04/30/2012 ExitCare Patient Information 2015 [employer]. This information is not intended to replace advice given to you by your health care provider. Make sure you discuss any questions you have with your health care provider. Alcohol and Nutrition Nutrition serves two purposes. It provides energy. It also maintains body structure and function. Food supplies energy. It also provides the building blocks needed to replace worn or damaged cells. Alcoholics often eat poorly. This limits their supply of essential nutrients. This affects energy supply and structure maintenance. Alcohol also affects the body's nutrients in:  Digestion.  Storage.  Using and getting rid of waste  products. IMPAIRMENT OF NUTRIENT DIGESTION AND UTILIZATION   Once ingested, food must be broken down into small components (digested). Then it is available for energy. It helps maintain body structure and function. Digestion begins in the mouth. It continues in the stomach and intestines, with help from the pancreas. The nutrients from digested food are absorbed from the intestines into the blood. Then they are carried to the liver. The liver prepares nutrients for:  Immediate use.  Storage and future use.  Alcohol inhibits the breakdown of nutrients into usable molecules.  It decreases secretion of digestive enzymes from the pancreas.  Alcohol impairs nutrient absorption by damaging the cells lining the stomach and intestines.  It also interferes with moving some nutrients into the blood.  In addition, nutritional deficiencies themselves may lead to further absorption problems.  For example, folate deficiency changes the cells that line the small intestine. This impairs how water is absorbed. It also affects absorbed nutrients. These include glucose, sodium, and additional folate.  Even if nutrients are digested and absorbed, alcohol can prevent them from being fully used. It changes their transport, storage, and excretion. Impaired utilization of nutrients by alcoholics is indicated by:  Decreased liver stores of vitamins, such as vitamin A.  Increased excretion of nutrients such as fat. ALCOHOL AND ENERGY SUPPLY   Three basic nutritional components found in food are:  Carbohydrates.  Proteins.  Fats.  These are used as energy. Some alcoholics take in as much as 50% of their total daily calories from alcohol. They often neglect important foods.  Even when enough food is eaten, alcohol can impair the ways the body controls blood sugar (glucose) levels. It may   either increase or decrease blood sugar.  In non-diabetic alcoholics, increased blood sugar (hyperglycemia) is caused  by poor insulin secretion. It is usually temporary.  Decreased blood sugar (hypoglycemia) can cause serious injury even if this condition is short-lived. Low blood sugar can happen when a fasting or malnourished person drinks alcohol. When there is no food to supply energy, stored sugar is used up. The products of alcohol inhibit forming glucose from other compounds such as amino acids. As a result, alcohol causes the brain and other body tissue to lack glucose. It is needed for energy and function.  Alcohol is an energy source. But how the body processes and uses the energy from alcohol is complex. Also, when alcohol is substituted for carbohydrates, subjects tend to lose weight. This indicates that they get less energy from alcohol than from food. ALCOHOL - MAINTAINING CELL STRUCTURE AND FUNCTION  Structure Cells are made mostly of protein. So an adequate protein diet is important for maintaining cell structure. This is especially true if cells are being damaged. Research indicates that alcohol affects protein nutrition by causing impaired:  Digestion of proteins to amino acids.  Processing of amino acids by the small intestine and liver.  Synthesis of proteins from amino acids.  Protein secretion by the liver. Function Nutrients are essential for the body to function well. They provide the tools that the body needs to work well:   Proteins.  Vitamins.  Minerals. Alcohol can disrupt body function. It may cause nutrient deficiencies. And it may interfere with the way nutrients are processed. Vitamins  Vitamins are essential to maintain growth and normal metabolism. They regulate many of the body`s processes. Chronic heavy drinking causes deficiencies in many vitamins. This is caused by eating less. And, in some cases, vitamins may be poorly absorbed. For example, alcohol inhibits fat absorption. It impairs how the vitamins A, E, and D are normally absorbed along with dietary fats. Not  enough vitamin A may cause night blindness. Not enough vitamin D may cause softening of the bones.  Some alcoholics lack vitamins A, C, D, E, K, and the B vitamins. These are all involved in wound healing and cell maintenance. In particular, because vitamin K is necessary for blood clotting, lacking that vitamin can cause delayed clotting. The result is excess bleeding. Lacking other vitamins involved in brain function may cause severe neurological damage. Minerals Deficiencies of minerals such as calcium, magnesium, iron, and zinc are common in alcoholics. The alcohol itself does not seem to affect how these minerals are absorbed. Rather, they seem to occur secondary to other alcohol-related problems, such as:  Less calcium absorbed.  Not enough magnesium.  More urinary excretion.  Vomiting.  Diarrhea.  Not enough iron due to gastrointestinal bleeding.  Not enough zinc or losses related to other nutrient deficiencies.  Mineral deficiencies can cause a variety of medical consequences. These range from calcium-related bone disease to zinc-related night blindness and skin lesions. ALCOHOL, MALNUTRITION, AND MEDICAL COMPLICATIONS  Liver Disease   Alcoholic liver damage is caused primarily by alcohol itself. But poor nutrition may increase the risk of alcohol-related liver damage. For example, nutrients normally found in the liver are known to be affected by drinking alcohol. These include carotenoids, which are the major sources of vitamin A, and vitamin E compounds. Decreases in such nutrients may play some role in alcohol-related liver damage. Pancreatitis  Research suggests that malnutrition may increase the risk of developing alcoholic pancreatitis. Research suggests that a diet lacking in   protein may increase alcohol's damaging effect on the pancreas. Brain  Nutritional deficiencies may have severe effects on brain function. These may be permanent. Specifically, thiamine deficiencies  are often seen in alcoholics. They can cause severe neurological problems. These include:  Impaired movement.  Memory loss seen in Wernicke-Korsakoff syndrome. Pregnancy  Alcohol has toxic effects on fetal development. It causes alcohol-related birth defects. They include fetal alcohol syndrome. Alcohol itself is toxic to the fetus. Also, the nutritional deficiency can affect how the fetus develops. That may compound the risk of developmental damage.  Nutritional needs during pregnancy are 10% to 30% greater than normal. Food intake can increase by as much as 140% to cover the needs of both mother and fetus. An alcoholic mother`s nutritional problems may adversely affect the nutrition of the fetus. And alcohol itself can also restrict nutrition flow to the fetus. NUTRITIONAL STATUS OF ALCOHOLICS  Techniques for assessing nutritional status include:  Taking body measurements to estimate fat reserves. They include:  Weight.  Height.  Mass.  Skin fold thickness.  Performing blood analysis to provide measurements of circulating:  Proteins.  Vitamins.  Minerals.  These techniques tend to be imprecise. For many nutrients, there is no clear "cut-off" point that would allow an accurate definition of deficiency. So assessing the nutritional status of alcoholics is limited by these techniques. Dietary status may provide information about the risk of developing nutritional problems. Dietary status is assessed by:  Taking patients' dietary histories.  Evaluating the amount and types of food they are eating.  It is difficult to determine what exact amount of alcohol begins to have damaging effects on nutrition. In general, moderate drinkers have 2 drinks or less per day. They seem to be at little risk for nutritional problems. Various medical disorders begin to appear at greater levels.  Research indicates that the majority of even the heaviest drinkers have few obvious nutritional  deficiencies. Many alcoholics who are hospitalized for medical complications of their disease do have severe malnutrition. Alcoholics tend to eat poorly. Often they eat less than the amounts of food necessary to provide enough:  Carbohydrates.  Protein.  Fat.  Vitamins A and C.  B vitamins.  Minerals like calcium and iron. Of major concern is alcohol's effect on digesting food and use of nutrients. It may shift a mildly malnourished person toward severe malnutrition. Document Released: 11/15/2004 Document Revised: 04/15/2011 Document Reviewed: 05/01/2005 Exit[employer] Patient Information 2015 [employer]. This information is not intended to replace advice given to you by your health care provider. Make sure you discuss any questions you have with your health care provider.  

## 2014-02-17 NOTE — Progress Notes (Signed)
Patient ID: Derrick Cook, male   DOB: 20-May-1955, 59 y.o.   MRN: [id number]   Derrick Cook, is a 59 y.o. male  [id number]  [id number]  DOB - 1955/10/01  Chief Complaint  Patient presents with  . Follow-up  . Hypertension        Subjective:   Derrick Cook is a 59 y.o. male here today for a follow up visit. Patient has history of alcohol abuse with alcohol withdrawal seizures in the past and DTs, he is here today complaining of severe headache that has been on for about 2-3 weeks, worse in the morning, global, about 7-8 out of 10, no history of fall or injury to the head, denies blurry vision, no associated nausea or vomiting. Patient also complained of rapidly Distending abdomen with easy satiety. He is not sure if he has cirrhosis of the liver. Patient's blood pressure was noticed to be high before now he is supposed to be taken 5 mg tablet of amlodipine, patient has not been compliant. He takes ibuprofen for the headache but it no longer helps, he asked if he can be prescribed Percocet. He also described erectile dysfunction, wanting Cialis. Patient continues to smoke heavily about 1 packet of cigarettes per day, and still drinks about a sixpack of alcohol in form of beer per day. Patient has No chest pain, No abdominal pain - No Nausea, No new weakness tingling or numbness, No Cough - SOB.  Problem  Other Vascular Headache  Other Male Erectile Dysfunction  Abdominal Distension    ALLERGIES: No Known Allergies  PAST MEDICAL HISTORY: Past Medical History  Diagnosis Date  . Memory problem   . Alcohol abuse   . Alcohol withdrawal seizure   . 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 Chronic left maxillary sinusitis   . Stroke   . Hypertension     MEDICATIONS AT HOME: Prior to Admission medications   Medication Sig Start Date End Date Taking? Authorizing Provider  amLODipine (NORVASC) 5 MG tablet Take 1 tablet (5 mg total)  by mouth daily. 02/17/14   Quentin Angst, MD  aspirin 81 MG tablet Take 81 mg by mouth every evening.    Historical Provider, MD  chlordiazePOXIDE (LIBRIUM) 25 MG capsule Take 1 capsule (25 mg total) by mouth 3 (three) times daily as needed for anxiety. 02/17/14   Quentin Angst, MD  diclofenac (VOLTAREN) 75 MG EC tablet Take 1 tablet (75 mg total) by mouth 2 (two) times daily. 02/17/14   Quentin Angst, MD  donepezil (ARICEPT) 10 MG tablet Take 10 mg by mouth at bedtime.    Historical Provider, MD  folic acid (FOLVITE) 1 MG tablet Take 1 tablet (1 mg total) by mouth daily. 02/17/14   Quentin Angst, MD  ibuprofen (ADVIL,MOTRIN) 800 MG tablet Take 1 tablet (800 mg total) by mouth every 8 (eight) hours as needed. Patient not taking: Reported on 02/17/2014 01/25/14   Quentin Angst, MD  Multiple Vitamin (MULTIVITAMIN WITH MINERALS) TABS tablet Take 1 tablet by mouth daily. Patient not taking: Reported on 02/17/2014 01/03/14   Alison Murray, MD  thiamine 100 MG tablet Take 1 tablet (100 mg total) by mouth daily. 02/17/14   Quentin Angst, MD     Objective:   Filed Vitals:   02/17/14 0916  BP: 143/90  Pulse: 72  Temp: 98.5 F (36.9 C)  TempSrc: Oral  Resp: 18  Height:  6\' 3"  (1.905 m)  SpO2: 99%    Exam General appearance : Awake, alert, not in any distress. Speech Clear. Not toxic looking HEENT: Atraumatic and Normocephalic, pupils equally reactive to light and accomodation Neck: supple, no JVD. No cervical lymphadenopathy.  Chest:Good air entry bilaterally, no added sounds  CVS: S1 S2 regular, no murmurs.  Abdomen: Bowel sounds present, mildly distended but Non tender, with no gaurding, rigidity or rebound. Extremities: B/L Lower Ext shows no edema, both legs are warm to touch Neurology: Awake alert, and oriented X 3, CN II-XII intact, Non focal  Data Review Lab Results  Component Value Date   HGBA1C 5.7* 12/28/2013     Assessment & Plan   1. Other  vascular headache  - diclofenac (VOLTAREN) 75 MG EC tablet; Take 1 tablet (75 mg total) by mouth 2 (two) times daily.  Dispense: 30 tablet; Refill: 0 - CT Head W Contrast; Future  2. Other male erectile dysfunction - Patient was counseled extensively on reduction in alcohol consumption - Multivitamins prescribed - We'll hold off on Cialis or Viagra for now until behavioral modifications approach explored  3. Alcohol dependence with withdrawal with complication  - chlordiazePOXIDE (LIBRIUM) 25 MG capsule; Take 1 capsule (25 mg total) by mouth 3 (three) times daily as needed for anxiety.  Dispense: 60 capsule; Refill: 3 - thiamine 100 MG tablet; Take 1 tablet (100 mg total) by mouth daily.  Dispense: 90 tablet; Refill: 3 - folic acid (FOLVITE) 1 MG tablet; Take 1 tablet (1 mg total) by mouth daily.  Dispense: 90 tablet; Refill: 3  4. Abdominal distension  - US Abdomen Complete; Future  5. Essential hypertension Prescribed - amLODipine (NORVASC) 5 MG tablet; Take 1 tablet (5 mg total) by mouth daily.  Dispense: 90 tablet; Refill: 3   Patient was extensively counseled on nutrition and exercise Patient was counseled extensively on smoking cessation.   Return in about 3 months (around 05/19/2014), or if symptoms worsen or fail to improve, for Follow up HTN, Follow up Pain and comorbidities.  The patient was given clear instructions to go to ER or return to medical center if symptoms don't improve, worsen or new problems develop. The patient verbalized understanding. The patient was told to call to get lab results if they haven't heard anything in the next week.   This note has been created with Education officer, environmentalDragon speech recognition software and smart phrase technology. Any transcriptional errors are unintentional.    Jeanann LewandowskyJEGEDE, Derrick Wickliff, MD, MHA, FACP, FAAP [facility] [place], [place] [phone]   02/17/2014, 9:54 AM

## 2014-02-18 ENCOUNTER — Telehealth: Payer: Self-pay | Admitting: Emergency Medicine

## 2014-02-18 NOTE — Telephone Encounter (Signed)
-----   Message from Quentin Angstlugbemiga E Jegede, MD sent at 02/02/2014  9:26 AM EST ----- Please inform patient that his HIV test is nonreactive as at 02/01/2014.

## 2014-02-18 NOTE — Telephone Encounter (Signed)
Pt given normal blood work results 

## 2014-02-19 ENCOUNTER — Other Ambulatory Visit: Payer: Self-pay | Admitting: Internal Medicine

## 2014-02-21 ENCOUNTER — Ambulatory Visit (HOSPITAL_COMMUNITY): Payer: Medicaid Other

## 2014-02-22 ENCOUNTER — Telehealth: Payer: Self-pay | Admitting: Internal Medicine

## 2014-02-22 ENCOUNTER — Other Ambulatory Visit: Payer: Self-pay | Admitting: Internal Medicine

## 2014-02-22 ENCOUNTER — Other Ambulatory Visit: Payer: Self-pay | Admitting: Emergency Medicine

## 2014-02-22 DIAGNOSIS — N529 Male erectile dysfunction, unspecified: Secondary | ICD-10-CM

## 2014-02-22 MED ORDER — MELOXICAM 15 MG PO TABS: 15.0000 mg | ORAL_TABLET | Freq: Every day | ORAL | Status: DC

## 2014-02-22 MED ORDER — TADALAFIL 20 MG PO TABS: 10.0000 mg | ORAL_TABLET | ORAL | Status: DC | PRN

## 2014-02-22 NOTE — Telephone Encounter (Signed)
Patient has come in today to get a prior authorization for three items: Ultra Sound, MRI and Script for pain (Voltarin) pending lab results; Patient states he was contacted last week and results were fine; please f/u with patient about this prior authorization as the patient has had to pay [facility] of [facility]; please f/u with patient about these requests

## 2014-02-25 ENCOUNTER — Ambulatory Visit (HOSPITAL_COMMUNITY)
Admission: RE | Admit: 2014-02-25 | Discharge: 2014-02-25 | Disposition: A | Discharge status: Home / Self Care | Payer: Medicaid Other | Source: Ambulatory Visit | Attending: Internal Medicine | Admitting: Internal Medicine

## 2014-02-25 ENCOUNTER — Other Ambulatory Visit: Payer: Self-pay | Admitting: Internal Medicine

## 2014-02-25 ENCOUNTER — Encounter (HOSPITAL_COMMUNITY): Payer: Self-pay

## 2014-02-25 DIAGNOSIS — G441 Vascular headache, not elsewhere classified: Secondary | ICD-10-CM | POA: Diagnosis present

## 2014-02-25 DIAGNOSIS — R140 Abdominal distension (gaseous): Secondary | ICD-10-CM

## 2014-02-25 MED ORDER — IOHEXOL 300 MG/ML  SOLN
50.0000 mL | Freq: Once | INTRAMUSCULAR | Status: AC | PRN
  Administered 2014-02-25: 50 mL via INTRAVENOUS

## 2014-03-07 ENCOUNTER — Telehealth: Payer: Self-pay | Admitting: Emergency Medicine

## 2014-03-07 NOTE — Telephone Encounter (Signed)
Pt/wife given xray results  Pt denies RUQ pain at this time Informed pt if pain continues, we will refer to Gen Surgery

## 2014-03-07 NOTE — Telephone Encounter (Signed)
-----   Message from Quentin Angstlugbemiga E Jegede, MD sent at 03/02/2014  3:43 PM EST ----- Please inform patient that his head CT shows no acute abnormality.

## 2014-03-14 ENCOUNTER — Telehealth: Payer: Self-pay | Admitting: Internal Medicine

## 2014-03-14 NOTE — Telephone Encounter (Signed)
LVM to return call   Notes Recorded by Quentin Angstlugbemiga E Jegede, MD on 03/02/2014 at 3:43 PM Please inform patient that his head CT shows no acute abnormality. Notes Recorded by Quentin Angstlugbemiga E Jegede, MD on 03/02/2014 at 3:42 PM Please inform patient that his abdominal ultrasound shows some gallbladder stones and sludge, but no evidence of infection or inflammation.  Liver shows fatty infiltration most likely from excessive alcohol use, there is also a size discrepancy between the 2 kidneys. Advise patient on cessation of alcohol use, if there is persistent right upper quadrant pain, we may need to refer patient to general surgery for gallbladder removal.  Monitor the kidney sizes grossly and also monitor blood pressure closely, his blood pressure remains high, then we may need to investigate the kidneys further.

## 2014-03-14 NOTE — Telephone Encounter (Signed)
Patient has come in today to ask for the results of his MRI; Patient is also requesting 800mg  Ibprofen for his back pain; please f/u with patient about these requests

## 2014-03-17 ENCOUNTER — Other Ambulatory Visit: Payer: Self-pay | Admitting: *Deleted

## 2014-03-17 ENCOUNTER — Other Ambulatory Visit: Payer: Self-pay | Admitting: Internal Medicine

## 2014-03-17 DIAGNOSIS — N528 Other male erectile dysfunction: Secondary | ICD-10-CM

## 2014-03-17 DIAGNOSIS — F10239 Alcohol dependence with withdrawal, unspecified: Secondary | ICD-10-CM

## 2014-03-17 MED ORDER — SILDENAFIL CITRATE 100 MG PO TABS: 100.0000 mg | ORAL_TABLET | Freq: Every day | ORAL | Status: DC | PRN

## 2014-03-17 MED ORDER — IBUPROFEN 800 MG PO TABS: 800.0000 mg | ORAL_TABLET | Freq: Three times a day (TID) | ORAL | Status: DC | PRN

## 2014-04-18 ENCOUNTER — Other Ambulatory Visit: Payer: Self-pay | Admitting: Internal Medicine

## 2014-04-18 MED ORDER — CHLORPROMAZINE HCL 25 MG PO TABS: 25.0000 mg | ORAL_TABLET | Freq: Three times a day (TID) | ORAL | Status: DC

## 2015-06-14 ENCOUNTER — Encounter (HOSPITAL_COMMUNITY): Payer: Self-pay | Admitting: Emergency Medicine

## 2015-06-14 ENCOUNTER — Emergency Department (HOSPITAL_COMMUNITY)
Admission: EM | Admit: 2015-06-14 | Discharge: 2015-06-14 | Disposition: A | Discharge status: Home / Self Care | Payer: Medicaid Other | Attending: Emergency Medicine | Admitting: Emergency Medicine

## 2015-06-14 ENCOUNTER — Emergency Department (HOSPITAL_COMMUNITY): Payer: Medicaid Other

## 2015-06-14 DIAGNOSIS — Y9302 Activity, running: Secondary | ICD-10-CM | POA: Diagnosis not present

## 2015-06-14 DIAGNOSIS — Y92828 Other wilderness area as the place of occurrence of the external cause: Secondary | ICD-10-CM | POA: Diagnosis not present

## 2015-06-14 DIAGNOSIS — S92002A Unspecified fracture of left calcaneus, initial encounter for closed fracture: Secondary | ICD-10-CM | POA: Insufficient documentation

## 2015-06-14 DIAGNOSIS — T07XXXA Unspecified multiple injuries, initial encounter: Secondary | ICD-10-CM

## 2015-06-14 DIAGNOSIS — S60512A Abrasion of left hand, initial encounter: Secondary | ICD-10-CM | POA: Insufficient documentation

## 2015-06-14 DIAGNOSIS — S80212A Abrasion, left knee, initial encounter: Secondary | ICD-10-CM | POA: Diagnosis not present

## 2015-06-14 DIAGNOSIS — W01198A Fall on same level from slipping, tripping and stumbling with subsequent striking against other object, initial encounter: Secondary | ICD-10-CM | POA: Insufficient documentation

## 2015-06-14 DIAGNOSIS — Y998 Other external cause status: Secondary | ICD-10-CM | POA: Diagnosis not present

## 2015-06-14 DIAGNOSIS — I10 Essential (primary) hypertension: Secondary | ICD-10-CM | POA: Insufficient documentation

## 2015-06-14 DIAGNOSIS — S60511A Abrasion of right hand, initial encounter: Secondary | ICD-10-CM | POA: Insufficient documentation

## 2015-06-14 DIAGNOSIS — S8992XA Unspecified injury of left lower leg, initial encounter: Secondary | ICD-10-CM | POA: Diagnosis present

## 2015-06-14 DIAGNOSIS — Z8709 Personal history of other diseases of the respiratory system: Secondary | ICD-10-CM | POA: Diagnosis not present

## 2015-06-14 DIAGNOSIS — Z8673 Personal history of transient ischemic attack (TIA), and cerebral infarction without residual deficits: Secondary | ICD-10-CM | POA: Diagnosis not present

## 2015-06-14 DIAGNOSIS — F17210 Nicotine dependence, cigarettes, uncomplicated: Secondary | ICD-10-CM | POA: Insufficient documentation

## 2015-06-14 DIAGNOSIS — W19XXXA Unspecified fall, initial encounter: Secondary | ICD-10-CM

## 2015-06-14 MED ORDER — OXYCODONE-ACETAMINOPHEN 5-325 MG PO TABS
2.0000 | ORAL_TABLET | Freq: Once | ORAL | Status: AC
  Administered 2015-06-14: 2 via ORAL
  Filled 2015-06-14: qty 2

## 2015-06-14 MED ORDER — OXYCODONE-ACETAMINOPHEN 5-325 MG PO TABS: 1.0000 | ORAL_TABLET | Freq: Four times a day (QID) | ORAL | Status: DC | PRN

## 2015-06-14 NOTE — ED Provider Notes (Signed)
CSN: [id number]     Arrival date & time 06/14/15  1510 History   First MD Initiated Contact with Patient 06/14/15 1647     Chief Complaint  Patient presents with  . Fall     (Consider location/radiation/quality/duration/timing/severity/associated sxs/prior Treatment) HPI Comments: Patient presents today with a chief complaint of left foot pain and left knee pain.  Pain has been present since falling down a hill last evening.  He states that he tripped and fell down a hill and then his foot hit concrete at the bottom of the hill.  Foot pain is located at the heel.  He took Tylenol for the pain this morning, which helped somewhat.  He denies hitting his head or LOC.  He denies back pain,neck pain, and chest pain.  He denies numbness or tingling.  Pain worse with palpation and also standing.  He reports that he has not been able to bear weight since the fall.  He reports tetanus is UTD.  Patient is a 60 y.o. male presenting with fall. The history is provided by the patient.  Fall    Past Medical History  Diagnosis Date  . Memory problem   . Alcohol abuse   . Alcohol withdrawal seizure (HCC)   . 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 Chronic left maxillary sinusitis   . Stroke (HCC)   . Hypertension    Past Surgical History  Procedure Laterality Date  . Back surgery     History reviewed. No pertinent family history. Social History  Substance Use Topics  . Smoking status: Current Every Day Smoker -- 0.50 packs/day    Types: Cigarettes  . Smokeless tobacco: Never Used  . Alcohol Use: 0.0 oz/week    0 Standard drinks or equivalent per week     Comment: Heavy alcohol use    Review of Systems  All other systems reviewed and are negative.     Allergies  Review of patient's allergies indicates no known allergies.  Home Medications   Prior to Admission medications   Medication Sig Start Date End Date Taking? Authorizing  Provider  amLODipine (NORVASC) 5 MG tablet Take 1 tablet (5 mg total) by mouth daily. Patient not taking: Reported on 06/14/2015 02/17/14   Quentin Angstlugbemiga E Jegede, MD  chlordiazePOXIDE (LIBRIUM) 25 MG capsule Take 1 capsule (25 mg total) by mouth 3 (three) times daily as needed for anxiety. Patient not taking: Reported on 06/14/2015 02/17/14   Quentin Angstlugbemiga E Jegede, MD  chlorproMAZINE (THORAZINE) 25 MG tablet Take 1 tablet (25 mg total) by mouth 3 (three) times daily. Patient not taking: Reported on 06/14/2015 04/18/14   Quentin Angstlugbemiga E Jegede, MD  folic acid (FOLVITE) 1 MG tablet Take 1 tablet (1 mg total) by mouth daily. Patient not taking: Reported on 06/14/2015 02/17/14   Quentin Angstlugbemiga E Jegede, MD  ibuprofen (ADVIL,MOTRIN) 800 MG tablet Take 1 tablet (800 mg total) by mouth every 8 (eight) hours as needed. Patient not taking: Reported on 06/14/2015 03/17/14   Quentin Angstlugbemiga E Jegede, MD  meloxicam (MOBIC) 15 MG tablet Take 1 tablet (15 mg total) by mouth daily. Patient not taking: Reported on 06/14/2015 02/22/14   Quentin Angstlugbemiga E Jegede, MD  Multiple Vitamin (MULTIVITAMIN WITH MINERALS) TABS tablet Take 1 tablet by mouth daily. Patient not taking: Reported on 02/17/2014 01/03/14   Alison MurrayAlma M Devine, MD  sildenafil (VIAGRA) 100 MG tablet Take 1 tablet (100 mg total) by mouth daily as needed  for erectile dysfunction. Patient not taking: Reported on 06/14/2015 03/17/14   Quentin Angst, MD  thiamine 100 MG tablet Take 1 tablet (100 mg total) by mouth daily. Patient not taking: Reported on 06/14/2015 02/17/14   Quentin Angst, MD   BP 121/96 mmHg  Pulse 124  Temp(Src) 99.4 F (37.4 C) (Oral)  Resp 18  Ht  (1.905 m)  Wt 81.647 kg  BMI 22.50 kg/m2  SpO2 98% Physical Exam  Constitutional: He appears well-developed and well-nourished.  HENT:  Head: Normocephalic and atraumatic.  Neck: Normal range of motion. Neck supple.  Cardiovascular: Normal rate, regular rhythm and normal heart sounds.   Pulmonary/Chest:  Effort normal and breath sounds normal.  Musculoskeletal: Normal range of motion.       Right wrist: He exhibits normal range of motion, no tenderness and no bony tenderness.       Left wrist: He exhibits normal range of motion, no tenderness and no bony tenderness.       Right hip: He exhibits normal range of motion, no tenderness and no swelling.       Left hip: He exhibits normal range of motion, no tenderness and no swelling.       Right knee: He exhibits normal range of motion. No tenderness found.       Right ankle: He exhibits normal range of motion and no swelling. No tenderness.       Left ankle: He exhibits normal range of motion and no swelling. No tenderness.       Cervical back: He exhibits normal range of motion, no tenderness, no bony tenderness, no swelling, no edema and no deformity.       Thoracic back: He exhibits normal range of motion, no tenderness, no bony tenderness, no swelling, no edema and no deformity.       Lumbar back: He exhibits normal range of motion, no tenderness, no bony tenderness, no swelling, no edema and no deformity.  Swelling of the left foot.  Skin intact.  Tenderness to palpation of the left calcaneous.   Pain with ROM of the left knee Full ROM of upper extremities bilaterally  Neurological: He is alert.  Skin: Skin is warm and dry.  Superficial abrasions to both hands Abrasion to the left knee  Psychiatric: He has a normal mood and affect.  Nursing note and vitals reviewed.   ED Course  Procedures (including critical care time) Labs Review Labs Reviewed - No data to display  Imaging Review Dg Knee Complete 4 Views Left  06/14/2015  CLINICAL DATA:  Left knee pain after a fall while running down a slope yesterday. EXAM: LEFT KNEE - COMPLETE 4+ VIEW COMPARISON:  None. FINDINGS: There is no fracture or dislocation. There is a small joint effusion. No arthritis. IMPRESSION: No acute bone abnormality.  Joint effusion. Electronically Signed   By:  Francene Boyers M.D.   On: 06/14/2015 16:21   Dg Hand Complete Left  06/14/2015  CLINICAL DATA:  Left hand pain after a fall running down a slope yesterday. EXAM: LEFT HAND - COMPLETE 3+ VIEW COMPARISON:  None. FINDINGS: There is no evidence of fracture or dislocation. 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 Soft tissues are unremarkable. IMPRESSION: No acute abnormality. Electronically Signed   By: Francene Boyers M.D.   On: 06/14/2015 16:23   Dg Hand Complete Right  06/14/2015  CLINICAL DATA:  Right hand pain after a fall while running down a slope yesterday. EXAM: RIGHT  HAND - COMPLETE 3+ VIEW COMPARISON:  None. FINDINGS: There is no evidence of fracture or dislocation. Slight arthritis of the DIP joints of the fingers. Soft tissues are unremarkable. IMPRESSION: No acute abnormalities. Electronically Signed   By: Francene Boyers M.D.   On: 06/14/2015 16:24   Dg Foot Complete Left  06/14/2015  CLINICAL DATA:  Status post fall down slope. EXAM: LEFT FOOT - COMPLETE 3+ VIEW COMPARISON:  None. FINDINGS: There is a nondisplaced fracture of the anterior lateral calcaneus. There is no evidence of arthropathy or other focal bone abnormality. Soft tissues are unremarkable. IMPRESSION: Nondisplaced fracture of the anterior lateral calcaneus. Electronically Signed   By: Elige Ko   On: 06/14/2015 16:23   I have personally reviewed and evaluated these images and lab results as part of my medical decision-making.   EKG Interpretation None      MDM   Final diagnoses:  None   Patient presents today after a mechanical fall that occurred last evening.  He denies hitting his head or LOC.  Xrays showing a calcaneus fracture.  Fracture is closed.  He is neurovascularly intact.  Patient given posterior splint, crutches, and follow up with Orthopedics.  Stable for discharge.  Return precautions given.    Santiago Glad, PA-C 06/15/15 1610  Bethann Berkshire, MD 06/16/15 1027

## 2015-06-14 NOTE — ED Notes (Signed)
Verbalized understanding discharge instructions and follow-up. In no acute distress.  Pt given wound care supplies.

## 2015-06-14 NOTE — ED Notes (Signed)
Pt fell while running down a slope yesterday. Pt complains L knee pain, L foot pain, and bilateral palm pain. Pt has abrasion to R and L palm. Pt also has abrasion to L kneecap. Pt has been unable to bear weight on L foot due to pain.

## 2017-04-11 ENCOUNTER — Other Ambulatory Visit: Payer: Self-pay

## 2017-04-11 ENCOUNTER — Encounter (HOSPITAL_COMMUNITY): Payer: Self-pay

## 2017-04-11 ENCOUNTER — Inpatient Hospital Stay (HOSPITAL_COMMUNITY)
Admission: EM | Admit: 2017-04-11 | Discharge: 2017-04-13 | DRG: 513 | Disposition: A | Discharge status: Home Health | Payer: Medicaid Other | Attending: General Surgery | Admitting: General Surgery

## 2017-04-11 ENCOUNTER — Emergency Department (HOSPITAL_COMMUNITY): Payer: Medicaid Other

## 2017-04-11 DIAGNOSIS — Y92410 Unspecified street and highway as the place of occurrence of the external cause: Secondary | ICD-10-CM

## 2017-04-11 DIAGNOSIS — M4856XA Collapsed vertebra, not elsewhere classified, lumbar region, initial encounter for fracture: Secondary | ICD-10-CM | POA: Diagnosis present

## 2017-04-11 DIAGNOSIS — Z8673 Personal history of transient ischemic attack (TIA), and cerebral infarction without residual deficits: Secondary | ICD-10-CM

## 2017-04-11 DIAGNOSIS — S62320A Displaced fracture of shaft of second metacarpal bone, right hand, initial encounter for closed fracture: Principal | ICD-10-CM | POA: Diagnosis present

## 2017-04-11 DIAGNOSIS — S2222XA Fracture of body of sternum, initial encounter for closed fracture: Secondary | ICD-10-CM | POA: Diagnosis present

## 2017-04-11 DIAGNOSIS — M549 Dorsalgia, unspecified: Secondary | ICD-10-CM

## 2017-04-11 DIAGNOSIS — S62322A Displaced fracture of shaft of third metacarpal bone, right hand, initial encounter for closed fracture: Secondary | ICD-10-CM | POA: Diagnosis present

## 2017-04-11 DIAGNOSIS — F17210 Nicotine dependence, cigarettes, uncomplicated: Secondary | ICD-10-CM | POA: Diagnosis present

## 2017-04-11 DIAGNOSIS — I10 Essential (primary) hypertension: Secondary | ICD-10-CM | POA: Diagnosis present

## 2017-04-11 DIAGNOSIS — S62324A Displaced fracture of shaft of fourth metacarpal bone, right hand, initial encounter for closed fracture: Secondary | ICD-10-CM | POA: Diagnosis present

## 2017-04-11 DIAGNOSIS — S32030A Wedge compression fracture of third lumbar vertebra, initial encounter for closed fracture: Secondary | ICD-10-CM

## 2017-04-11 MED ORDER — ACETAMINOPHEN 500 MG PO TABS
1000.0000 mg | ORAL_TABLET | Freq: Once | ORAL | Status: AC
Start: 2017-04-11 — End: 2017-04-11
  Administered 2017-04-11: 1000 mg via ORAL
  Filled 2017-04-11: qty 2

## 2017-04-11 MED ORDER — TETANUS-DIPHTH-ACELL PERTUSSIS 5-2.5-18.5 LF-MCG/0.5 IM SUSP
0.5000 mL | Freq: Once | INTRAMUSCULAR | Status: AC
  Administered 2017-04-11: 0.5 mL via INTRAMUSCULAR
  Filled 2017-04-11: qty 0.5
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
## 2017-04-11 NOTE — ED Notes (Signed)
Bed: WA04 Expected date:  Expected time:  Means of arrival:  Comments: EMS 
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
## 2017-04-12 ENCOUNTER — Emergency Department (HOSPITAL_COMMUNITY): Payer: Medicaid Other

## 2017-04-12 ENCOUNTER — Inpatient Hospital Stay (HOSPITAL_COMMUNITY): Payer: Medicaid Other | Admitting: Anesthesiology

## 2017-04-12 ENCOUNTER — Encounter (HOSPITAL_COMMUNITY): Payer: Self-pay | Admitting: Radiology

## 2017-04-12 ENCOUNTER — Encounter (HOSPITAL_COMMUNITY): Admission: EM | Disposition: A | Discharge status: Home Health | Payer: Self-pay | Source: Home / Self Care

## 2017-04-12 DIAGNOSIS — Z8673 Personal history of transient ischemic attack (TIA), and cerebral infarction without residual deficits: Secondary | ICD-10-CM | POA: Diagnosis not present

## 2017-04-12 DIAGNOSIS — S62322A Displaced fracture of shaft of third metacarpal bone, right hand, initial encounter for closed fracture: Secondary | ICD-10-CM | POA: Diagnosis present

## 2017-04-12 DIAGNOSIS — S62320A Displaced fracture of shaft of second metacarpal bone, right hand, initial encounter for closed fracture: Secondary | ICD-10-CM | POA: Diagnosis present

## 2017-04-12 DIAGNOSIS — S62324A Displaced fracture of shaft of fourth metacarpal bone, right hand, initial encounter for closed fracture: Secondary | ICD-10-CM | POA: Diagnosis present

## 2017-04-12 DIAGNOSIS — I10 Essential (primary) hypertension: Secondary | ICD-10-CM | POA: Diagnosis present

## 2017-04-12 DIAGNOSIS — M4856XA Collapsed vertebra, not elsewhere classified, lumbar region, initial encounter for fracture: Secondary | ICD-10-CM | POA: Diagnosis present

## 2017-04-12 DIAGNOSIS — F17210 Nicotine dependence, cigarettes, uncomplicated: Secondary | ICD-10-CM | POA: Diagnosis present

## 2017-04-12 DIAGNOSIS — S2222XA Fracture of body of sternum, initial encounter for closed fracture: Secondary | ICD-10-CM | POA: Diagnosis present

## 2017-04-12 DIAGNOSIS — Y92410 Unspecified street and highway as the place of occurrence of the external cause: Secondary | ICD-10-CM | POA: Diagnosis not present

## 2017-04-12 DIAGNOSIS — M549 Dorsalgia, unspecified: Secondary | ICD-10-CM | POA: Diagnosis present

## 2017-04-12 HISTORY — PX: OPEN REDUCTION INTERNAL FIXATION (ORIF) METACARPAL: SHX6234

## 2017-04-12 LAB — COMPREHENSIVE METABOLIC PANEL
ALBUMIN: 4.1 g/dL (ref 3.5–5.0)
ALT: 42 U/L (ref 17–63)
AST: 56 U/L — ABNORMAL HIGH (ref 15–41)
Alkaline Phosphatase: 96 U/L (ref 38–126)
Anion gap: 12 (ref 5–15)
BILIRUBIN TOTAL: 0.8 mg/dL (ref 0.3–1.2)
BUN: 6 mg/dL (ref 6–20)
CO2: 26 mmol/L (ref 22–32)
CREATININE: 1.04 mg/dL (ref 0.61–1.24)
Calcium: 9.3 mg/dL (ref 8.9–10.3)
Chloride: 101 mmol/L (ref 101–111)
GFR calc Af Amer: >60 mL/min (ref >60)
GFR calc non Af Amer: >60 mL/min (ref >60)
GLUCOSE: 117 mg/dL — AB (ref 65–99)
Potassium: 3.3 mmol/L — ABNORMAL LOW (ref 3.5–5.1)
SODIUM: 139 mmol/L (ref 135–145)
TOTAL PROTEIN: 7.9 g/dL (ref 6.5–8.1)

## 2017-04-12 LAB — ETHANOL: Alcohol, Ethyl (B): 13 mg/dL — ABNORMAL HIGH (ref <10)

## 2017-04-12 LAB — CBC
HEMATOCRIT: 45 % (ref 39.0–52.0)
HEMOGLOBIN: 15.5 g/dL (ref 13.0–17.0)
MCH: 32.6 pg (ref 26.0–34.0)
MCHC: 34.4 g/dL (ref 30.0–36.0)
MCV: 94.5 fL (ref 78.0–100.0)
Platelets: 289 10*3/uL (ref 150–400)
RBC: 4.76 MIL/uL (ref 4.22–5.81)
RDW: 14 % (ref 11.5–15.5)
WBC: 13.2 10*3/uL — ABNORMAL HIGH (ref 4.0–10.5)

## 2017-04-12 LAB — HIV ANTIBODY (ROUTINE TESTING W REFLEX): HIV Screen 4th Generation wRfx: NONREACTIVE

## 2017-04-12 SURGERY — OPEN REDUCTION INTERNAL FIXATION (ORIF) METACARPAL
Anesthesia: Monitor Anesthesia Care | Site: Arm Lower | Laterality: Right

## 2017-04-12 MED ORDER — OXYCODONE-ACETAMINOPHEN 5-325 MG PO TABS
1.0000 | ORAL_TABLET | Freq: Once | ORAL | Status: AC
  Administered 2017-04-12: 1 via ORAL
  Filled 2017-04-12: qty 1

## 2017-04-12 MED ORDER — FENTANYL CITRATE (PF) 100 MCG/2ML IJ SOLN
50.0000 ug | Freq: Once | INTRAMUSCULAR | Status: AC
  Administered 2017-04-12: 50 ug via INTRAVENOUS
  Filled 2017-04-12: qty 2

## 2017-04-12 MED ORDER — FENTANYL CITRATE (PF) 100 MCG/2ML IJ SOLN: 25.0000 ug | INTRAMUSCULAR | Status: DC | PRN

## 2017-04-12 MED ORDER — IOPAMIDOL (ISOVUE-300) INJECTION 61%
INTRAVENOUS | Status: AC
  Filled 2017-04-12: qty 100

## 2017-04-12 MED ORDER — CEFAZOLIN SODIUM-DEXTROSE 2-4 GM/100ML-% IV SOLN
INTRAVENOUS | Status: AC
  Filled 2017-04-12: qty 100

## 2017-04-12 MED ORDER — CEFAZOLIN SODIUM-DEXTROSE 2-4 GM/100ML-% IV SOLN
2.0000 g | INTRAVENOUS | Status: AC
  Administered 2017-04-12: 2 g via INTRAVENOUS

## 2017-04-12 MED ORDER — MIDAZOLAM HCL 2 MG/2ML IJ SOLN
INTRAMUSCULAR | Status: AC
  Filled 2017-04-12: qty 2

## 2017-04-12 MED ORDER — OXYCODONE HCL 5 MG PO TABS: 5.0000 mg | ORAL_TABLET | Freq: Once | ORAL | Status: DC | PRN

## 2017-04-12 MED ORDER — ADULT MULTIVITAMIN W/MINERALS CH
1.0000 | ORAL_TABLET | Freq: Every day | ORAL | Status: DC
  Administered 2017-04-13: 1 via ORAL
  Filled 2017-04-12 (×2): qty 1

## 2017-04-12 MED ORDER — OXYCODONE HCL 5 MG/5ML PO SOLN: 5.0000 mg | Freq: Once | ORAL | Status: DC | PRN

## 2017-04-12 MED ORDER — FOLIC ACID 1 MG PO TABS
1.0000 mg | ORAL_TABLET | Freq: Every day | ORAL | Status: DC
  Administered 2017-04-13: 1 mg via ORAL
  Filled 2017-04-12: qty 1

## 2017-04-12 MED ORDER — DOCUSATE SODIUM 100 MG PO CAPS
100.0000 mg | ORAL_CAPSULE | Freq: Two times a day (BID) | ORAL | Status: DC
  Administered 2017-04-13: 100 mg via ORAL
  Filled 2017-04-12: qty 1

## 2017-04-12 MED ORDER — MIDAZOLAM HCL 5 MG/5ML IJ SOLN
INTRAMUSCULAR | Status: DC | PRN
  Administered 2017-04-12: 2 mg via INTRAVENOUS

## 2017-04-12 MED ORDER — PROPOFOL 500 MG/50ML IV EMUL
INTRAVENOUS | Status: DC | PRN
  Administered 2017-04-12: 150 ug/kg/min via INTRAVENOUS
  Administered 2017-04-12: 15:00:00 via INTRAVENOUS

## 2017-04-12 MED ORDER — LORAZEPAM 2 MG/ML IJ SOLN: 1.0000 mg | Freq: Four times a day (QID) | INTRAMUSCULAR | Status: DC | PRN

## 2017-04-12 MED ORDER — CHLORHEXIDINE GLUCONATE 4 % EX LIQD: 60.0000 mL | Freq: Once | CUTANEOUS | Status: DC

## 2017-04-12 MED ORDER — PROPOFOL 10 MG/ML IV BOLUS
INTRAVENOUS | Status: AC
  Filled 2017-04-12: qty 20

## 2017-04-12 MED ORDER — ONDANSETRON 4 MG PO TBDP
4.0000 mg | ORAL_TABLET | Freq: Four times a day (QID) | ORAL | Status: DC | PRN
  Filled 2017-04-12: qty 1

## 2017-04-12 MED ORDER — MORPHINE SULFATE (PF) 2 MG/ML IV SOLN: 2.0000 mg | INTRAVENOUS | Status: DC | PRN

## 2017-04-12 MED ORDER — 0.9 % SODIUM CHLORIDE (POUR BTL) OPTIME
TOPICAL | Status: DC | PRN
  Administered 2017-04-12: 1000 mL

## 2017-04-12 MED ORDER — ONDANSETRON HCL 4 MG/2ML IJ SOLN
INTRAMUSCULAR | Status: DC | PRN
  Administered 2017-04-12: 4 mg via INTRAVENOUS

## 2017-04-12 MED ORDER — FENTANYL CITRATE (PF) 100 MCG/2ML IJ SOLN
INTRAMUSCULAR | Status: DC | PRN
  Administered 2017-04-12: 50 ug via INTRAVENOUS

## 2017-04-12 MED ORDER — FENTANYL CITRATE (PF) 250 MCG/5ML IJ SOLN
INTRAMUSCULAR | Status: AC
  Filled 2017-04-12: qty 5

## 2017-04-12 MED ORDER — THIAMINE HCL 100 MG/ML IJ SOLN: 100.0000 mg | Freq: Every day | INTRAMUSCULAR | Status: DC

## 2017-04-12 MED ORDER — BUPIVACAINE HCL (PF) 0.25 % IJ SOLN
INTRAMUSCULAR | Status: AC
  Filled 2017-04-12: qty 30

## 2017-04-12 MED ORDER — ENOXAPARIN SODIUM 40 MG/0.4ML ~~LOC~~ SOLN: 40.0000 mg | SUBCUTANEOUS | Status: DC

## 2017-04-12 MED ORDER — IOPAMIDOL (ISOVUE-300) INJECTION 61%
100.0000 mL | Freq: Once | INTRAVENOUS | Status: AC | PRN
  Administered 2017-04-12: 100 mL via INTRAVENOUS

## 2017-04-12 MED ORDER — BUPIVACAINE-EPINEPHRINE (PF) 0.5% -1:200000 IJ SOLN
INTRAMUSCULAR | Status: DC | PRN
  Administered 2017-04-12: 30 mL via PERINEURAL

## 2017-04-12 MED ORDER — PROPOFOL 500 MG/50ML IV EMUL
INTRAVENOUS | Status: AC
Start: 2017-04-12 — End: ?
  Filled 2017-04-12: qty 50

## 2017-04-12 MED ORDER — CEFAZOLIN SODIUM-DEXTROSE 1-4 GM/50ML-% IV SOLN
1.0000 g | Freq: Three times a day (TID) | INTRAVENOUS | Status: DC
  Administered 2017-04-12 (×2): 1 g via INTRAVENOUS
  Filled 2017-04-12 (×3): qty 50

## 2017-04-12 MED ORDER — ONDANSETRON HCL 4 MG/2ML IJ SOLN: 4.0000 mg | Freq: Four times a day (QID) | INTRAMUSCULAR | Status: DC | PRN

## 2017-04-12 MED ORDER — CLONIDINE HCL (ANALGESIA) 100 MCG/ML EP SOLN
EPIDURAL | Status: DC | PRN
  Administered 2017-04-12: 100 ug

## 2017-04-12 MED ORDER — KCL IN DEXTROSE-NACL 20-5-0.45 MEQ/L-%-% IV SOLN
INTRAVENOUS | Status: DC
  Administered 2017-04-12: 06:00:00 via INTRAVENOUS
  Filled 2017-04-12: qty 1000

## 2017-04-12 MED ORDER — LORAZEPAM 2 MG/ML IJ SOLN: 0.0000 mg | Freq: Two times a day (BID) | INTRAMUSCULAR | Status: DC

## 2017-04-12 MED ORDER — PROPOFOL 1000 MG/100ML IV EMUL
INTRAVENOUS | Status: AC
  Filled 2017-04-12: qty 100

## 2017-04-12 MED ORDER — AEROCHAMBER PLUS FLO-VU LARGE MISC: 1.0000 | Freq: Once | Status: DC

## 2017-04-12 MED ORDER — HYDROCODONE-ACETAMINOPHEN 5-325 MG PO TABS
1.0000 | ORAL_TABLET | ORAL | Status: DC | PRN
  Administered 2017-04-12 – 2017-04-13 (×3): 1 via ORAL
  Filled 2017-04-12 (×3): qty 1

## 2017-04-12 MED ORDER — LACTATED RINGERS IV SOLN
INTRAVENOUS | Status: DC
  Administered 2017-04-12: 13:00:00 via INTRAVENOUS

## 2017-04-12 MED ORDER — POTASSIUM CHLORIDE CRYS ER 20 MEQ PO TBCR
40.0000 meq | EXTENDED_RELEASE_TABLET | Freq: Once | ORAL | Status: AC
  Administered 2017-04-12: 40 meq via ORAL
  Filled 2017-04-12: qty 2

## 2017-04-12 MED ORDER — LORAZEPAM 1 MG PO TABS: 1.0000 mg | ORAL_TABLET | Freq: Four times a day (QID) | ORAL | Status: DC | PRN

## 2017-04-12 MED ORDER — VITAMIN B-1 100 MG PO TABS
100.0000 mg | ORAL_TABLET | Freq: Every day | ORAL | Status: DC
  Administered 2017-04-13: 100 mg via ORAL
  Filled 2017-04-12: qty 1

## 2017-04-12 MED ORDER — LORAZEPAM 2 MG/ML IJ SOLN: 0.0000 mg | Freq: Four times a day (QID) | INTRAMUSCULAR | Status: DC

## 2017-04-12 MED ORDER — SODIUM CHLORIDE 0.9 % IJ SOLN
INTRAMUSCULAR | Status: AC
  Filled 2017-04-12: qty 50

## 2017-04-12 SURGICAL SUPPLY — 71 items
BANDAGE ACE 3X5.8 VEL STRL LF (GAUZE/BANDAGES/DRESSINGS) ×3 IMPLANT
BANDAGE ACE 4X5 VEL STRL LF (GAUZE/BANDAGES/DRESSINGS) ×3 IMPLANT
BANDAGE ELASTIC 3 VELCRO ST LF (GAUZE/BANDAGES/DRESSINGS) ×3 IMPLANT
BANDAGE ELASTIC 4 VELCRO ST LF (GAUZE/BANDAGES/DRESSINGS) ×3 IMPLANT
BIT DRILL 1.1X3.5 QK RELEA (DRILL) ×2 IMPLANT
BIT DRILL 1.1X3.5 QUICK RELEAS (DRILL) ×6
BIT DRILL 2X3.5 HAND QK RELEAS (BIT) ×1 IMPLANT
BIT DRILL 2X3.5 QUICK RELEASE (BIT) ×3
BLADE CLIPPER SURG (BLADE) IMPLANT
BNDG ESMARK 4X9 LF (GAUZE/BANDAGES/DRESSINGS) ×3 IMPLANT
BNDG GAUZE ELAST 4 BULKY (GAUZE/BANDAGES/DRESSINGS) ×3 IMPLANT
BNDG PLASTER X FAST 3X3 WHT LF (CAST SUPPLIES) ×3 IMPLANT
CLOSURE WOUND 1/2 X4 (GAUZE/BANDAGES/DRESSINGS)
CORDS BIPOLAR (ELECTRODE) ×3 IMPLANT
COVER SURGICAL LIGHT HANDLE (MISCELLANEOUS) ×3 IMPLANT
CUFF TOURNIQUET SINGLE 18IN (TOURNIQUET CUFF) ×3 IMPLANT
CUFF TOURNIQUET SINGLE 24IN (TOURNIQUET CUFF) IMPLANT
DRAIN TLS ROUND 10FR (DRAIN) IMPLANT
DRAPE OEC MINIVIEW 54X84 (DRAPES) ×3 IMPLANT
DRAPE SURG 17X11 SM STRL (DRAPES) ×3 IMPLANT
DRSG ADAPTIC 3X8 NADH LF (GAUZE/BANDAGES/DRESSINGS) ×3 IMPLANT
GAUZE SPONGE 4X4 12PLY STRL (GAUZE/BANDAGES/DRESSINGS) ×3 IMPLANT
GAUZE SPONGE 4X4 16PLY XRAY LF (GAUZE/BANDAGES/DRESSINGS) ×3 IMPLANT
GLOVE BIOGEL PI IND STRL 8.5 (GLOVE) ×1 IMPLANT
GLOVE BIOGEL PI INDICATOR 8.5 (GLOVE) ×2
GLOVE SURG ORTHO 8.0 STRL STRW (GLOVE) ×3 IMPLANT
GOWN STRL REUS W/ TWL LRG LVL3 (GOWN DISPOSABLE) ×3 IMPLANT
GOWN STRL REUS W/ TWL XL LVL3 (GOWN DISPOSABLE) ×1 IMPLANT
GOWN STRL REUS W/TWL LRG LVL3 (GOWN DISPOSABLE) ×6
GOWN STRL REUS W/TWL XL LVL3 (GOWN DISPOSABLE) ×2
KIT BASIN OR (CUSTOM PROCEDURE TRAY) ×3 IMPLANT
KIT ROOM TURNOVER OR (KITS) ×3 IMPLANT
MANIFOLD NEPTUNE II (INSTRUMENTS) ×3 IMPLANT
NEEDLE HYPO 25X1 1.5 SAFETY (NEEDLE) ×3 IMPLANT
NS IRRIG 1000ML POUR BTL (IV SOLUTION) ×3 IMPLANT
PACK ORTHO EXTREMITY (CUSTOM PROCEDURE TRAY) ×3 IMPLANT
PAD ARMBOARD 7.5X6 YLW CONV (MISCELLANEOUS) ×6 IMPLANT
PAD CAST 4YDX4 CTTN HI CHSV (CAST SUPPLIES) ×1 IMPLANT
PADDING CAST COTTON 4X4 STRL (CAST SUPPLIES) ×2
PADDING CAST SYNTHETIC 4 (CAST SUPPLIES) ×2
PADDING CAST SYNTHETIC 4X4 STR (CAST SUPPLIES) ×1 IMPLANT
PLATE STRAIGHT 10HOLE 1.3MM (Plate) ×3 IMPLANT
SCREW BONE LAG 2.3X10MM HEXA (Screw) ×1 IMPLANT
SCREW BONE LAG 2.3X12MM HEXA (Screw) ×2 IMPLANT
SCREW BONE MD 1.5X13MM HEXA (Screw) ×1 IMPLANT
SCREW BONE MD LCKG 1.5X6MM HEX (Screw) ×2 IMPLANT
SCREW BONE NON-LOCK 1.5X12 (Screw) ×6 IMPLANT
SCREW BONE NON-LOCK 1.5X14 (Screw) ×3 IMPLANT
SCREW BONE NON-LOCK 1.5X16 (Screw) ×3 IMPLANT
SCREW LAG 2.3X10MM (Screw) ×3 IMPLANT
SCREW LAG 2.3X12MM (Screw) ×6 IMPLANT
SCREW MD 1.5X13MM (Screw) ×3 IMPLANT
SCREW MD LOCKING 1.5X6MM (Screw) ×6 IMPLANT
SCREW NONLOCK 2.3X13MM (Screw) ×6 IMPLANT
SCREW NONLOCK TI 2.3X11 (Screw) ×3 IMPLANT
SOAP 2 % CHG 4 OZ (WOUND CARE) ×3 IMPLANT
STRIP CLOSURE SKIN 1/2X4 (GAUZE/BANDAGES/DRESSINGS) IMPLANT
SUT ETHILON 4 0 PS 2 18 (SUTURE) IMPLANT
SUT MNCRL AB 4-0 PS2 18 (SUTURE) IMPLANT
SUT PROLENE 3 0 PS 2 (SUTURE) ×9 IMPLANT
SUT VIC AB 2-0 FS1 27 (SUTURE) ×9 IMPLANT
SUT VIC AB 4-0 PS2 27 (SUTURE) ×3 IMPLANT
SUT VICRYL 4-0 PS2 18IN ABS (SUTURE) ×3 IMPLANT
SYR CONTROL 10ML LL (SYRINGE) IMPLANT
SYSTEM CHEST DRAIN TLS 7FR (DRAIN) IMPLANT
TOWEL OR 17X24 6PK STRL BLUE (TOWEL DISPOSABLE) ×3 IMPLANT
TOWEL OR 17X26 10 PK STRL BLUE (TOWEL DISPOSABLE) ×3 IMPLANT
TUBE CONNECTING 12'X1/4 (SUCTIONS) ×1
TUBE CONNECTING 12X1/4 (SUCTIONS) ×2 IMPLANT
WATER STERILE IRR 1000ML POUR (IV SOLUTION) ×3 IMPLANT
YANKAUER SUCT BULB TIP NO VENT (SUCTIONS) IMPLANT

## 2017-04-12 NOTE — Anesthesia Postprocedure Evaluation (Signed)
Anesthesia Post Note  Patient: Derrick Cook  Procedure(s) Performed: OPEN REDUCTION INTERNAL FIXATION (ORIF) METACARPAL MIDDLE AND INDEX FINGER (Right Arm Lower)     Patient location during evaluation: PACU Anesthesia Type: MAC and Regional Level of consciousness: awake and alert Pain management: pain level controlled Vital Signs Assessment: post-procedure vital signs reviewed and stable Respiratory status: spontaneous breathing, nonlabored ventilation, respiratory function stable and patient connected to nasal cannula oxygen Cardiovascular status: stable and blood pressure returned to baseline Postop Assessment: no apparent nausea or vomiting Anesthetic complications: no    Last Vitals:  Vitals:   04/12/17 1555 04/12/17 1614  BP: 140/77 133/82  Pulse: 62 65  Resp: 17 18  Temp: (!) 36.2 C 36.5 C  SpO2: 100% 99%    Last Pain:  Vitals:   04/12/17 1933  TempSrc:   PainSc: 0-No pain                 Hanalei Glace

## 2017-04-12 NOTE — ED Notes (Signed)
EKG given to EDP,Allen,MD., for review. 

## 2017-04-12 NOTE — Anesthesia Procedure Notes (Signed)
Anesthesia Regional Block: Supraclavicular block   Pre-Anesthetic Checklist: ,, timeout performed, Correct Patient, Correct Site, Correct Laterality, Correct Procedure, Correct Position, site marked, Risks and benefits discussed,  Surgical consent,  Pre-op evaluation,  At surgeon's request and post-op pain management  Laterality: Upper and Right  Prep: chloraprep       Needles:  Injection technique: Single-shot  Needle Type: Echogenic Needle          Additional Needles:   Procedures:,,,, ultrasound used (permanent image in chart),,,,  Narrative:  Start time: 04/12/2017 1:07 PM End time: 04/12/2017 1:10 PM Injection made incrementally with aspirations every 5 mL.  Performed by: Personally   Additional Notes: H+P and labs reviewed, risks and benefits discussed with patient, procedure tolerated well without complications

## 2017-04-12 NOTE — Transfer of Care (Signed)
Immediate Anesthesia Transfer of Care Note  Patient: Derrick Cook  Procedure(s) Performed: OPEN REDUCTION INTERNAL FIXATION (ORIF) METACARPAL MIDDLE AND INDEX FINGER (Right Arm Lower)  Patient Location: PACU  Anesthesia Type:MAC  Level of Consciousness: sedated  Airway & Oxygen Therapy: Patient Spontanous Breathing and Patient connected to face mask oxygen  Post-op Assessment: Report given to RN and Post -op Vital signs reviewed and stable  Post vital signs: Reviewed and stable  Last Vitals:  Vitals:   04/12/17 0542 04/12/17 1525  BP: (!) 154/94 140/77  Pulse: 73 69  Resp:  13  Temp: 37.1 C (!) 36.2 C  SpO2: 97% 100%    Last Pain:  Vitals:   04/12/17 0607  TempSrc:   PainSc: Asleep      Patients Stated Pain Goal: 4 (99/24/26 8341)  Complications: No apparent anesthesia complications
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
## 2017-04-12 NOTE — Progress Notes (Signed)
Orthopedic Tech Progress Note Patient Details:  Derrick PebblesKevin Cook 01/28/1956 [id number]  Patient ID: Derrick PebblesKevin Cook, male   DOB: 12/09/1955, 62 y.o.   MRN: [id number]   Derrick HollerJennifer C KoontzCalled [employer] for LSO brace. 04/12/2017, 1:05 PM
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
## 2017-04-12 NOTE — Anesthesia Preprocedure Evaluation (Addendum)
Anesthesia Evaluation  Patient identified by MRN, date of birth, ID band Patient awake    Reviewed: Allergy & Precautions, NPO status , Patient's Chart, lab work & pertinent test results  History of Anesthesia Complications Negative for: history of anesthetic complications  Airway Mallampati: I  TM Distance: >3 FB Neck ROM: Full    Dental  (+) Edentulous Upper, Edentulous Lower, Dental Advisory Given   Pulmonary Current Smoker,    breath sounds clear to auscultation       Cardiovascular hypertension, Pt. on medications (-) angina(-) Past MI and (-) CHF  Rhythm:Regular     Neuro/Psych  Headaches, Seizures -,  PSYCHIATRIC DISORDERS CVA    GI/Hepatic negative GI ROS, (+)     substance abuse  alcohol use,   Endo/Other  negative endocrine ROS  Renal/GU negative Renal ROS     Musculoskeletal   Abdominal   Peds  Hematology   Anesthesia Other Findings   Reproductive/Obstetrics                            Anesthesia Physical Anesthesia Plan  ASA: III  Anesthesia Plan: MAC and Regional   Post-op Pain Management:    Induction:   PONV Risk Score and Plan: 0 and Treatment may vary due to age or medical condition  Airway Management Planned: Nasal Cannula  Additional Equipment:   Intra-op Plan:   Post-operative Plan:   Informed Consent: I have reviewed the patients History and Physical, chart, labs and discussed the procedure including the risks, benefits and alternatives for the proposed anesthesia with the patient or authorized representative who has indicated his/her understanding and acceptance.   Dental advisory given  Plan Discussed with: CRNA and Surgeon  Anesthesia Plan Comments:         Anesthesia Quick Evaluation

## 2017-04-13 LAB — BASIC METABOLIC PANEL
Anion gap: 12 (ref 5–15)
BUN: 5 mg/dL — ABNORMAL LOW (ref 6–20)
CHLORIDE: 99 mmol/L — AB (ref 101–111)
CO2: 22 mmol/L (ref 22–32)
CREATININE: 1.22 mg/dL (ref 0.61–1.24)
Calcium: 8.5 mg/dL — ABNORMAL LOW (ref 8.9–10.3)
GFR calc non Af Amer: >60 mL/min (ref >60)
Glucose, Bld: 169 mg/dL — ABNORMAL HIGH (ref 65–99)
Potassium: 3 mmol/L — ABNORMAL LOW (ref 3.5–5.1)
Sodium: 133 mmol/L — ABNORMAL LOW (ref 135–145)

## 2017-04-13 LAB — CBC
HEMATOCRIT: 40.3 % (ref 39.0–52.0)
HEMOGLOBIN: 13.5 g/dL (ref 13.0–17.0)
MCH: 31.9 pg (ref 26.0–34.0)
MCHC: 33.5 g/dL (ref 30.0–36.0)
MCV: 95.3 fL (ref 78.0–100.0)
Platelets: 244 10*3/uL (ref 150–400)
RBC: 4.23 MIL/uL (ref 4.22–5.81)
RDW: 14.3 % (ref 11.5–15.5)
WBC: 9.7 10*3/uL (ref 4.0–10.5)

## 2017-04-13 MED ORDER — HYDROCODONE-ACETAMINOPHEN 5-325 MG PO TABS: 1.0000 | ORAL_TABLET | ORAL | 0 refills | Status: AC | PRN

## 2017-04-13 MED ORDER — ACETAMINOPHEN 325 MG PO TABS: ORAL_TABLET | ORAL | Status: AC

## 2017-04-13 MED ORDER — DOCUSATE SODIUM 100 MG PO CAPS: 100.0000 mg | ORAL_CAPSULE | Freq: Every day | ORAL | 0 refills | Status: AC | PRN
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
## 2017-04-13 NOTE — Progress Notes (Signed)
NEUROSURGERY PROGRESS NOTE  Doing well. Complains of appropriate mild back soreness. No numbness, tingling or weakness down legs Ambulating and voiding well Good strength and sensation   Temp:  [97.2 F (36.2 C)-100.7 F (38.2 C)] 100.7 F (38.2 C) (03/10 0543) Pulse Rate:  [62-88] 88 (03/10 0543) Resp:  [13-20] 20 (03/10 0543) BP: (113-152)/(69-98) 152/83 (03/10 0543) SpO2:  [90 %-100 %] 90 % (03/10 0543)  Plan: Wear brace when up and mobilizing. Follow up with [facility] in 2 weeks. Appropriate for discharge from nsgy standpoint  Sherryl MangesKimberly Hannah Jacquelynn Friend, NP 04/13/2017 9:17 AM
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
## 2017-04-13 NOTE — Discharge Instructions (Signed)
Right arm: Do not remove the dressing.  You can use the sling to keep the arm supported when you are up and walking.  Back: You have a brace you need to wear that whenever you are out of bed, you can take this off to shower and then put it back on after you shower. How to Use a Back Brace A back brace is a form-fitting device that wraps around your trunk to support your lower back, abdomen, and hips. You may need to wear a back brace to relieve back pain or to correct a medical condition related to the back, such as abnormal curvature of the spine (scoliosis). A back brace can maintain or correct the shape of the spine and prevent a spinal problem from getting worse. A back brace can also take pressure off the layers of tissue (disks) between the bones of the spine (vertebrae). You may need a back brace to keep your back and spine in place while you heal from an injury or recover from surgery. Back braces can be either plastic (rigid brace) or soft elastic (dynamic brace). A rigid brace usually covers both the front and back of the entire upper body. A soft brace may cover only the lower back and abdomen and may fasten with self-adhesive elastic straps. Your health care provider will recommend the proper brace for your needs and medical condition. What are the risks?  A back brace may not help if you do not wear it as directed by your health care provider. Be sure to wear the brace exactly as instructed in order to prevent further back problems.  Wearing the brace may be uncomfortable at first. You may have trouble sleeping with it on. It may also be hard for you to do certain activities while wearing the brace. How to use a back brace Different types of braces will have different instructions for use. Follow instructions from your health care provider about:  How to put on the brace.  When and how often to wear the brace. In some cases, braces may need to be worn for long stretches of time. For  example, a brace may need to be worn for 16-23 hours a day when used for scoliosis.  How to take off the brace.  Any safety tips you should follow when wearing the brace. This may include: ? Moving carefully while wearing the brace. The brace restricts your movement and could lead to additional injuries. ? Using a cane or walker for support if you feel unsteady. ? Sitting in high, firm chairs. It may be difficult to stand up from low, soft chairs.  How to care for a back brace  Do not let the back brace get wet. Typically, you will remove the brace for bathing and then put it back on afterward.  If you have a rigid brace, be sure to store it in a safe place when you are not wearing it. This will help to prevent damage.  Clean or wash the back brace with mild soap and water as told by your health care provider. Contact a health care provider if:  Your brace gets damaged.  You have pain or discomfort when wearing the back brace.  Your back pain is getting worse or is not improving over time. This information is not intended to replace advice given to you by your health care provider. Make sure you discuss any questions you have with your health care provider. Document Released: 01/10/2011 Document Revised: 02/16/2015  Document Reviewed: 09/14/2014 Elsevier Interactive Patient Education  [employer]2018 Elsevier Inc.    How to Use a Sling - for your right arm A sling is a type of hanging bandage that is worn around your neck to protect an injured arm, shoulder, or other body part. You may need to wear a sling to keep you from moving (immobilize) the injured body part while it heals. Keeping the injured part of your body still reduces pain and speeds up healing. Your health care provider may recommend using a sling if you have:  A broken arm.  A broken collarbone.  A shoulder injury.  Surgery.  What are the risks? Wearing a sling the wrong way can:  Make your injury worse.  Cause  stiffness or numbness.  Affect blood circulation in your arm and hand. This can causetingling or numbness in your fingers or hands.  How to use a sling The way that you should use a sling depends on your injury. It is important that you follow all of your health care providers instructions for your injury. Also follow these general guidelines:  Wear the sling so that your arm bends 90 degrees at the elbow. That is like a right angle or the shape of a capital letter "L." The sling should also support your wrist and your hand.  Try to avoid moving your arm.  Do not lie down flat on your back while wearing a sling. Sleep in a recliner or use pillows to raise your upper body in bed.  Do not twist, raise, or move your arm in a way that could make your injury worse.  Do not lean on your arm while wearing a sling.  Do not lift anything while wearing a sling.  Contact a health care provider if:  You have bruising, swelling, or pain that is getting worse.  Your pain medicine is not helping.  You have a fever. Get help right away if:  Your fingers are numb or tingling.  Your fingers turn blue or feel cold to the touch.  You cannot control the bleeding from your injury.  You are short of breath. This information is not intended to replace advice given to you by your health care provider. Make sure you discuss any questions you have with your health care provider. Document Released: 09/05/2003 Document Revised: 06/29/2015 Document Reviewed: 11/24/2013 Elsevier Interactive Patient Education  2018 [employer]

## 2017-04-13 NOTE — Progress Notes (Signed)
Written and verbal discharge instructions provided.  Verbalizes understanding.  Patient and his wife did not want to wait for a sling.  Ortho Tech had been notified of this need but they both wanted to leave without due to a transportation concern.  Patient ambulated out of the hospital with his family at discharge.
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
## 2017-04-15 ENCOUNTER — Encounter (HOSPITAL_COMMUNITY): Payer: Self-pay | Admitting: Orthopedic Surgery

## 2017-04-17 ENCOUNTER — Telehealth (HOSPITAL_COMMUNITY): Payer: Self-pay

## 2017-04-17 NOTE — Telephone Encounter (Signed)
Derrick Cook wife - Jennette Kettle called and wanted to know why her husband needed to be seen at [facility] as well as the appointment with Dr. Melvyn Novas. She can be reached at [phone].
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
## 2017-05-01 ENCOUNTER — Ambulatory Visit: Payer: No Typology Code available for payment source | Admitting: *Deleted

## 2017-05-13 ENCOUNTER — Ambulatory Visit: Payer: Medicaid Other | Attending: Orthopedic Surgery | Admitting: Occupational Therapy

## 2017-05-13 DIAGNOSIS — M79641 Pain in right hand: Secondary | ICD-10-CM | POA: Insufficient documentation

## 2017-05-13 DIAGNOSIS — M25641 Stiffness of right hand, not elsewhere classified: Secondary | ICD-10-CM | POA: Diagnosis present

## 2017-05-13 DIAGNOSIS — M6281 Muscle weakness (generalized): Secondary | ICD-10-CM | POA: Insufficient documentation
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
## 2017-05-13 NOTE — Patient Instructions (Signed)
WEARING SCHEDULE:  Wear splint at ALL times except for hygiene care (May remove splint for exercises and bathing  then immediately place back on if up and moving about or out in public, sleep in splint) PURPOSE:  To prevent movement and for protection until injury can heal  CARE OF SPLINT:  Keep splint away from heat sources including: stove, radiator or furnace, or a car in sunlight. The splint can melt and will no longer fit you properly  Keep away from pets and children  Clean the splint with rubbing alcohol 1-2 times per day.  * During this time, make sure you also clean your hand/arm as instructed by your therapist and/or perform dressing changes as needed. You may remove splint and shower, do not submerge hand in bathtub. Then dry hand/arm completely before replacing splint.  PRECAUTIONS/POTENTIAL PROBLEMS: *If you notice or experience increased pain, swelling, numbness, or a lingering reddened area from the splint: Contact your therapist immediately by calling [phone]. You must wear the splint for protection, but we will get you scheduled for adjustments as quickly as possible.  (If only straps or hooks need to be replaced and NO adjustments to the splint need to be made, just call the office ahead and let them know you are coming in)  If you have any medical concerns or signs of infection, please call your doctor immediately            Flexor Tendon Gliding (Active Hook Fist)   With fingers and knuckles straight, bend middle and tip joints. Do not bend large knuckles. Repeat _10-15___ times. Do _4-6___ sessions per day.  MP Flexion (Active)   With back of hand on table, bend large knuckles as far as they will go, keeping small joints straight. Repeat _10-15___ times. Do __4-6__ sessions per day. Activity: Reach into a narrow container.*      Finger Flexion / Extension   With palm up, bend fingers of left hand toward palm, making a  fist. Straighten fingers,  opening fist. Repeat sequence _10-15___ times per session. Do _4-6__ sessions per day. Hand Variation: Palm down   AROM: Wrist Extension   .  With __right__ palm down, bend wrist up. Repeat __15__ times per set.  Do __4-6__ sessions per day.      Copyright  VHI. All rights reserved.

## 2017-05-27 ENCOUNTER — Ambulatory Visit: Payer: Medicaid Other | Admitting: Occupational Therapy

## 2017-06-06 ENCOUNTER — Ambulatory Visit: Payer: Medicaid Other | Admitting: Occupational Therapy

## 2017-06-09 ENCOUNTER — Ambulatory Visit: Payer: Medicaid Other | Attending: Orthopedic Surgery | Admitting: Occupational Therapy

## 2017-06-09 ENCOUNTER — Encounter: Payer: Self-pay | Admitting: Occupational Therapy

## 2017-06-09 DIAGNOSIS — M6281 Muscle weakness (generalized): Secondary | ICD-10-CM

## 2017-06-09 DIAGNOSIS — M79641 Pain in right hand: Secondary | ICD-10-CM | POA: Diagnosis not present

## 2017-06-09 DIAGNOSIS — M25641 Stiffness of right hand, not elsewhere classified: Secondary | ICD-10-CM | POA: Diagnosis present

## 2017-06-09 NOTE — Patient Instructions (Signed)
To reduce swelling massage your right hand with your left hand from fingertips to wrist with lotion 5 mins per day , then exercise your right hand with your hand elevated above the level of your heart. continue performing the active exercises issued at first visit. Flexor Tendon Gliding (Active Hook Fist)   With fingers and knuckles straight, bend middle and tip joints. Do not bend large knuckles. Repeat _10-15___ times. Do _4-6___ sessions per day.  MP Flexion (Active)   With back of hand on table, bend large knuckles as far as they will go, keeping small joints straight. Repeat _10-15___ times. Do __4-6__ sessions per day. Activity: Reach into a narrow container.*      Finger Flexion / Extension   With palm up, bend fingers of left hand toward palm, making a  fist. Straighten fingers, opening fist. Repeat sequence _10-15___ times per session. Do _4-6__ sessions per day. Hand Variation: Palm down   Copyright  VHI. All rights reserved.      PROM: Finger MP Joints   Passively bend __of all fingers______ finger of hand at big knuckle until stretch is felt. Hold _10___ seconds. Relax. Straighten finger as far as possible. Repeat __5__ times per set.  Do __4-6__ sessions per day.      1. Grip Strengthening (Resistive Putty)   Squeeze putty using thumb and all fingers. Repeat _20___ times. Do __2__ sessions per day.   2. Roll putty into tube on table and pinch between each finger and thumb x 10 reps each. (can do ring and small finger together)     Copyright  VHI. All rights reserved.
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
## 2017-12-23 ENCOUNTER — Ambulatory Visit: Payer: Medicaid Other | Admitting: Family Medicine
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
